# Patient Record
Sex: Female | Born: 1935 | Race: Black or African American | Hispanic: No | Marital: Married | Smoking: Never smoker
Health system: Southern US, Community
[De-identification: ages and names within clinical notes are randomized; demographics above are authoritative.]

## PROBLEM LIST (undated history)

## (undated) DIAGNOSIS — E119 Type 2 diabetes mellitus without complications: Secondary | ICD-10-CM

---

## 2004-05-19 ENCOUNTER — Emergency Department (HOSPITAL_COMMUNITY): Admission: EM | Admit: 2004-05-19 | Discharge: 2004-05-19 | Payer: Self-pay | Admitting: Emergency Medicine

## 2004-07-05 ENCOUNTER — Inpatient Hospital Stay (HOSPITAL_COMMUNITY): Admission: EM | Admit: 2004-07-05 | Discharge: 2004-07-07 | Payer: Self-pay | Admitting: Emergency Medicine

## 2009-04-10 ENCOUNTER — Ambulatory Visit (HOSPITAL_COMMUNITY): Admission: RE | Admit: 2009-04-10 | Discharge: 2009-04-10 | Payer: Self-pay | Admitting: Unknown Physician Specialty

## 2009-04-10 ENCOUNTER — Ambulatory Visit: Payer: Self-pay | Admitting: Internal Medicine

## 2009-12-27 ENCOUNTER — Ambulatory Visit: Payer: Self-pay | Admitting: Internal Medicine

## 2009-12-27 DIAGNOSIS — Z87448 Personal history of other diseases of urinary system: Secondary | ICD-10-CM | POA: Insufficient documentation

## 2009-12-27 DIAGNOSIS — I10 Essential (primary) hypertension: Secondary | ICD-10-CM | POA: Insufficient documentation

## 2009-12-27 DIAGNOSIS — I509 Heart failure, unspecified: Secondary | ICD-10-CM | POA: Insufficient documentation

## 2009-12-27 DIAGNOSIS — N189 Chronic kidney disease, unspecified: Secondary | ICD-10-CM

## 2009-12-27 DIAGNOSIS — R21 Rash and other nonspecific skin eruption: Secondary | ICD-10-CM | POA: Insufficient documentation

## 2009-12-27 DIAGNOSIS — E860 Dehydration: Secondary | ICD-10-CM

## 2009-12-29 DIAGNOSIS — E785 Hyperlipidemia, unspecified: Secondary | ICD-10-CM | POA: Insufficient documentation

## 2010-01-02 LAB — CONVERTED CEMR LAB
Basophils Absolute: 0 10*3/uL (ref 0.0–0.1)
Basophils Relative: 0.5 % (ref 0.0–3.0)
Eosinophils Absolute: 0.4 10*3/uL (ref 0.0–0.7)
Eosinophils Relative: 4.8 % (ref 0.0–5.0)
HCT: 32.7 % — ABNORMAL LOW (ref 36.0–46.0)
Hemoglobin: 10.8 g/dL — ABNORMAL LOW (ref 12.0–15.0)
IgE (Immunoglobulin E), Serum: 251.1 intl units/mL — ABNORMAL HIGH (ref 0.0–180.0)
Lymphocytes Relative: 24.4 % (ref 12.0–46.0)
Lymphs Abs: 2.2 10*3/uL (ref 0.7–4.0)
MCHC: 33 g/dL (ref 30.0–36.0)
MCV: 79.3 fL (ref 78.0–100.0)
Monocytes Absolute: 0.5 10*3/uL (ref 0.1–1.0)
Monocytes Relative: 6.1 % (ref 3.0–12.0)
Neutro Abs: 5.7 10*3/uL (ref 1.4–7.7)
Neutrophils Relative %: 64.2 % (ref 43.0–77.0)
Platelets: 221 10*3/uL (ref 150.0–400.0)
RBC: 4.12 M/uL (ref 3.87–5.11)
RDW: 15.9 % — ABNORMAL HIGH (ref 11.5–14.6)
WBC: 8.9 10*3/uL (ref 4.5–10.5)

## 2010-03-12 ENCOUNTER — Ambulatory Visit (HOSPITAL_COMMUNITY): Admission: RE | Admit: 2010-03-12 | Discharge: 2010-03-12 | Payer: Self-pay | Admitting: Unknown Physician Specialty

## 2010-04-08 ENCOUNTER — Ambulatory Visit: Admission: RE | Admit: 2010-04-08 | Discharge: 2010-04-08 | Payer: Self-pay | Admitting: Unknown Physician Specialty

## 2010-04-08 ENCOUNTER — Ambulatory Visit (HOSPITAL_COMMUNITY): Admission: RE | Admit: 2010-04-08 | Discharge: 2010-04-08 | Payer: Self-pay | Admitting: Unknown Physician Specialty

## 2010-05-01 ENCOUNTER — Inpatient Hospital Stay (HOSPITAL_COMMUNITY)
Admission: EM | Admit: 2010-05-01 | Discharge: 2010-05-05 | Payer: Self-pay | Source: Home / Self Care | Admitting: Emergency Medicine

## 2010-08-29 ENCOUNTER — Emergency Department (HOSPITAL_COMMUNITY)
Admission: EM | Admit: 2010-08-29 | Discharge: 2010-08-29 | Payer: Self-pay | Source: Home / Self Care | Admitting: Emergency Medicine

## 2010-08-30 ENCOUNTER — Inpatient Hospital Stay (HOSPITAL_COMMUNITY)
Admission: EM | Admit: 2010-08-30 | Discharge: 2010-09-08 | Payer: Self-pay | Source: Home / Self Care | Attending: Internal Medicine | Admitting: Internal Medicine

## 2010-10-23 NOTE — Assessment & Plan Note (Signed)
Summary: ALLERGY PROBLEM/ MBW   Vital Signs:  Patient profile:   75 year old female Weight:      217.38 pounds O2 Sat:      98 % on Room air Pulse rate:   83 / minute BP sitting:   110 / 70  (left arm) Cuff size:   regular  Vitals Entered By: Reynaldo Minium CMA (December 27, 2009 9:39 AM)  O2 Flow:  Room air  History of Present Illness: December 27, 2009- 74 yoF with mental limitation, referred by staff at [facility] on behalf of Dr Candie Echevaria. Verbal report wa of concern about allergy evaluation for 'breaking out'. Details and description were not available at the time of our meeting and the patient couldn't confirm or clarify this problem. She actively denied any rash, itching or other acute discomfort. She did not seem to be a reliable source of information. An attendant/ driver offerred no additional informatin. Phone call to Judeth Cornfield at [facility] reported the problem.  Reviewe of med list shows diphenhydramine available but not listed as currently taking it.  Preventive Screening-Counseling & Management  Alcohol-Tobacco     Smoking Status: never  Current Medications (verified): 1)  Multivitamins  Tabs (Multiple Vitamin) .... Take 1 By Mouth Once Daily 2)  Hydrochlorothiazide 25 Mg Tabs (Hydrochlorothiazide) .... Take 1 By Mouth Once Daily 3)  Aspirin 81 Mg Tbec (Aspirin) .... Take 1 By Mouth Once Daily 4)  Gemfibrozil 600 Mg Tabs (Gemfibrozil) .... Take 1 By Mouth Two Times A Day 5)  Enalapril Maleate 10 Mg Tabs (Enalapril Maleate) .... Take 1 By Mouth Two Times A Day 6)  Metformin Hcl 850 Mg Tabs (Metformin Hcl) .... Take 1 By Mouth Two Times A Day 7)  Meloxicam 7.5 Mg Tabs (Meloxicam) .... Take 1 By Mouth Once Daily For Oa 8)  Lactulose 10 Gm/83ml Soln (Lactulose) .... Give 30cc By Mouth Once Daily 9)  Simvastatin 40 Mg Tabs (Simvastatin) .... Take 1 By Mouth Once Daily 10)  Diphenhydramine Hcl 25 Mg Caps (Diphenhydramine Hcl) .... Taie 1 By Mouth Every 4 Hours  As Needed For Ithiness, Insomnia,or Allergy 11)  Guaifenesin 100 Mg/47ml Syrp (Guaifenesin) .... Take 2 Teaspoonsful By Mouth Every 4 Hours As Needed For Cough 12)  Loratadine 10 Mg Tabs (Loratadine) .... Take 1 By Mouth Once Daily As Needed 13)  Humulin N 100 Unit/ml Susp (Insulin Isophane Human) .... Inject 40 Units Subcutaneously Every Morning 14)  Humulin N 100 Unit/ml Susp (Insulin Isophane Human) .... Inject 20 Units Subcutaneously At Bedtime 15)  Humulin R 100 Unit/ml Soln (Insulin Regular Human) .... Sliding Scale  Allergies (verified): No Known Drug Allergies  Past History:  Family History: Last updated: 12/27/2009 Unavailable from patient  Social History: Last updated: 12/27/2009 Single No children Lives at [facility] Patient never smoked.   Risk Factors: Smoking Status: never (12/27/2009)  Past Medical History: Diabetes Rash Hyperlipidemia  Past Surgical History: Patient didn't remember any surgeries  Family History: Unavailable from patient  Social History: Single No children Lives at [facility] Patient never smoked.  Smoking Status:  never  Review of Systems      See HPI       The patient complains of sneezing.  The patient denies shortness of breath with activity, shortness of breath at rest, productive cough, non-productive cough, coughing up blood, chest pain, irregular heartbeats, acid heartburn, indigestion, loss of appetite, weight change, abdominal pain, difficulty swallowing, sore throat, tooth/dental problems,  headaches, nasal congestion/difficulty breathing through nose, itching, ear ache, anxiety, depression, hand/feet swelling, joint stiffness or pain, rash, change in color of mucus, and fever.    Physical Exam  Additional Exam:  General: A/Ox3; pleasant and cooperative, NAD,obese,  Skin:- No rash or excoriation. A hyuperpigmented scar on left upper quadrant is nonspecific. Old fungal dermatosis is not ruled out NODES: no  lymphadenopathy HEENT: [place]/AT, EOM- WNL, Conjuctivae- clear, PERRLA, TM-WNL, Nose- clear, Throat- clear and wnl, many missing teeth NECK: Supple w/ fair ROM, JVD- none, normal carotid impulses w/o bruits Thyroid- normal to palpation CHEST: Clear to P&A HEART: RRR, no m/g/r heard ABDOMEN: Soft and nl;  ZOX:WRUE, nl pulses, no edema , rolling walker NEURO: Grossly intact to observation      Impression & Recommendations:  Problem # 1:  SKIN RASH (ICD-782.1)  We got almost no hx and she can offer none, denying that she has had rash, itching or known allergy. I can only speculate she might have had urticaria from her ACEI ( Enalapril) and if problem continues, this might be changed.This drug class may be associated with angioedema and hives. There is a nonspecific area on abdomen that could be an old scar, but might have  been an old fungal dermatosis like blastomycosis. She didn't remember where it came from. We will get CBC and Immunocap to assess IgE activation as might be associated with an environmental allergy.  Medications Added to Medication List This Visit: 1)  Multivitamins Tabs (Multiple vitamin) .... Take 1 by mouth once daily 2)  Hydrochlorothiazide 25 Mg Tabs (Hydrochlorothiazide) .... Take 1 by mouth once daily 3)  Aspirin 81 Mg Tbec (Aspirin) .... Take 1 by mouth once daily 4)  Gemfibrozil 600 Mg Tabs (Gemfibrozil) .... Take 1 by mouth two times a day 5)  Enalapril Maleate 10 Mg Tabs (Enalapril maleate) .... Take 1 by mouth two times a day 6)  Metformin Hcl 850 Mg Tabs (Metformin hcl) .... Take 1 by mouth two times a day 7)  Meloxicam 7.5 Mg Tabs (Meloxicam) .... Take 1 by mouth once daily for oa 8)  Lactulose 10 Gm/47ml Soln (Lactulose) .... Give 30cc by mouth once daily 9)  Simvastatin 40 Mg Tabs (Simvastatin) .... Take 1 by mouth once daily 10)  Diphenhydramine Hcl 25 Mg Caps (Diphenhydramine hcl) .... Taie 1 by mouth every 4 hours as needed for ithiness, insomnia,or  allergy 11)  Guaifenesin 100 Mg/81ml Syrp (Guaifenesin) .... Take 2 teaspoonsful by mouth every 4 hours as needed for cough 12)  Loratadine 10 Mg Tabs (Loratadine) .... Take 1 by mouth once daily as needed 13)  Humulin N 100 Unit/ml Susp (Insulin isophane human) .... Inject 40 units subcutaneously every morning 14)  Humulin N 100 Unit/ml Susp (Insulin isophane human) .... Inject 20 units subcutaneously at bedtime 15)  Humulin R 100 Unit/ml Soln (Insulin regular human) .... Sliding scale  Other Orders: Consultation Level II (45409) TLB-CBC Platelet - w/Differential (85025-CBCD) T-Allergy Profile Region II-DC, [place], [place], [place], [place] [phone])  Patient Instructions: 1)  Please schedule a follow-up appointment as needed. 2)  lab 3)  If rash is seen again, then suggest possible urticaria from the ACE inhibitor Enalapril. Consider changing this to a different drug class like Diovan or Benicar if appropriate 4)  I can see her again if needed, but would ask a more detailed referral history than she was able to give today.

## 2010-10-23 NOTE — Progress Notes (Signed)
Summary: Physician's handwritten notes/[facility]  Physician's handwritten notes/[facility]   Imported By: Sherian Rein 01/01/2010 08:56:41  _____________________________________________________________________  External Attachment:    Type:   Image     Comment:   External Document

## 2010-12-01 LAB — GLUCOSE, CAPILLARY
Glucose-Capillary: 123 mg/dL — ABNORMAL HIGH (ref 70–99)
Glucose-Capillary: 130 mg/dL — ABNORMAL HIGH (ref 70–99)
Glucose-Capillary: 132 mg/dL — ABNORMAL HIGH (ref 70–99)
Glucose-Capillary: 133 mg/dL — ABNORMAL HIGH (ref 70–99)
Glucose-Capillary: 136 mg/dL — ABNORMAL HIGH (ref 70–99)
Glucose-Capillary: 139 mg/dL — ABNORMAL HIGH (ref 70–99)
Glucose-Capillary: 146 mg/dL — ABNORMAL HIGH (ref 70–99)
Glucose-Capillary: 154 mg/dL — ABNORMAL HIGH (ref 70–99)
Glucose-Capillary: 154 mg/dL — ABNORMAL HIGH (ref 70–99)
Glucose-Capillary: 156 mg/dL — ABNORMAL HIGH (ref 70–99)
Glucose-Capillary: 165 mg/dL — ABNORMAL HIGH (ref 70–99)
Glucose-Capillary: 208 mg/dL — ABNORMAL HIGH (ref 70–99)
Glucose-Capillary: 135 mg/dL — ABNORMAL HIGH (ref 70–99)
Glucose-Capillary: 140 mg/dL — ABNORMAL HIGH (ref 70–99)
Glucose-Capillary: 152 mg/dL — ABNORMAL HIGH (ref 70–99)
Glucose-Capillary: 164 mg/dL — ABNORMAL HIGH (ref 70–99)
Glucose-Capillary: 168 mg/dL — ABNORMAL HIGH (ref 70–99)
Glucose-Capillary: 178 mg/dL — ABNORMAL HIGH (ref 70–99)
Glucose-Capillary: 179 mg/dL — ABNORMAL HIGH (ref 70–99)
Glucose-Capillary: 215 mg/dL — ABNORMAL HIGH (ref 70–99)
Glucose-Capillary: 216 mg/dL — ABNORMAL HIGH (ref 70–99)
Glucose-Capillary: 240 mg/dL — ABNORMAL HIGH (ref 70–99)

## 2010-12-01 LAB — COMPREHENSIVE METABOLIC PANEL
ALT: 32 U/L (ref 0–35)
ALT: 32 U/L (ref 0–35)
ALT: 17 U/L (ref 0–35)
ALT: 19 U/L (ref 0–35)
AST: 30 U/L (ref 0–37)
AST: 31 U/L (ref 0–37)
AST: 35 U/L (ref 0–37)
AST: 31 U/L (ref 0–37)
Albumin: 2.6 g/dL — ABNORMAL LOW (ref 3.5–5.2)
Albumin: 2.7 g/dL — ABNORMAL LOW (ref 3.5–5.2)
Albumin: 3 g/dL — ABNORMAL LOW (ref 3.5–5.2)
Alkaline Phosphatase: 73 U/L (ref 39–117)
Alkaline Phosphatase: 87 U/L (ref 39–117)
Alkaline Phosphatase: 95 U/L (ref 39–117)
BUN: 14 mg/dL (ref 6–23)
BUN: 19 mg/dL (ref 6–23)
BUN: 22 mg/dL (ref 6–23)
BUN: 19 mg/dL (ref 6–23)
CO2: 22 mEq/L (ref 19–32)
CO2: 19 mEq/L (ref 19–32)
CO2: 22 mEq/L (ref 19–32)
Calcium: 9 mg/dL (ref 8.4–10.5)
Calcium: 9.2 mg/dL (ref 8.4–10.5)
Calcium: 8.7 mg/dL (ref 8.4–10.5)
Calcium: 9.4 mg/dL (ref 8.4–10.5)
Chloride: 102 mEq/L (ref 96–112)
Chloride: 104 mEq/L (ref 96–112)
Chloride: 107 mEq/L (ref 96–112)
Creatinine, Ser: 0.91 mg/dL (ref 0.4–1.2)
GFR calc Af Amer: >60 mL/min (ref >60)
GFR calc Af Amer: >60 mL/min (ref >60)
GFR calc Af Amer: >60 mL/min (ref >60)
GFR calc Af Amer: >60 mL/min (ref >60)
GFR calc non Af Amer: >60 mL/min (ref >60)
GFR calc non Af Amer: 46 mL/min — ABNORMAL LOW (ref >60)
GFR calc non Af Amer: 56 mL/min — ABNORMAL LOW (ref >60)
Glucose, Bld: 122 mg/dL — ABNORMAL HIGH (ref 70–99)
Glucose, Bld: 145 mg/dL — ABNORMAL HIGH (ref 70–99)
Glucose, Bld: 122 mg/dL — ABNORMAL HIGH (ref 70–99)
Potassium: 3.4 mEq/L — ABNORMAL LOW (ref 3.5–5.1)
Potassium: 3.6 mEq/L (ref 3.5–5.1)
Potassium: 4.3 mEq/L (ref 3.5–5.1)
Potassium: 4.3 mEq/L (ref 3.5–5.1)
Sodium: 137 mEq/L (ref 135–145)
Sodium: 138 mEq/L (ref 135–145)
Sodium: 136 mEq/L (ref 135–145)
Sodium: 139 mEq/L (ref 135–145)
Total Bilirubin: 0.5 mg/dL (ref 0.3–1.2)
Total Bilirubin: 0.6 mg/dL (ref 0.3–1.2)
Total Bilirubin: 0.9 mg/dL (ref 0.3–1.2)
Total Protein: 6.4 g/dL (ref 6.0–8.3)
Total Protein: 6.4 g/dL (ref 6.0–8.3)
Total Protein: 6.9 g/dL (ref 6.0–8.3)
Total Protein: 7.2 g/dL (ref 6.0–8.3)

## 2010-12-01 LAB — BASIC METABOLIC PANEL
BUN: 10 mg/dL (ref 6–23)
CO2: 23 mEq/L (ref 19–32)
Calcium: 9 mg/dL (ref 8.4–10.5)
Chloride: 103 mEq/L (ref 96–112)
Chloride: 105 mEq/L (ref 96–112)
Creatinine, Ser: 0.86 mg/dL (ref 0.4–1.2)
GFR calc Af Amer: >60 mL/min (ref >60)
GFR calc Af Amer: >60 mL/min (ref >60)
GFR calc Af Amer: >60 mL/min (ref >60)
GFR calc non Af Amer: >60 mL/min (ref >60)
Glucose, Bld: 176 mg/dL — ABNORMAL HIGH (ref 70–99)
Potassium: 3.5 mEq/L (ref 3.5–5.1)
Potassium: 3.7 mEq/L (ref 3.5–5.1)
Potassium: 3.8 mEq/L (ref 3.5–5.1)
Sodium: 136 mEq/L (ref 135–145)
Sodium: 137 mEq/L (ref 135–145)

## 2010-12-01 LAB — CBC
HCT: 34.8 % — ABNORMAL LOW (ref 36.0–46.0)
HCT: 38 % (ref 36.0–46.0)
HCT: 38.4 % (ref 36.0–46.0)
HCT: 34 % — ABNORMAL LOW (ref 36.0–46.0)
HCT: 36.9 % (ref 36.0–46.0)
HCT: 38.1 % (ref 36.0–46.0)
Hemoglobin: 11.4 g/dL — ABNORMAL LOW (ref 12.0–15.0)
Hemoglobin: 12.9 g/dL (ref 12.0–15.0)
Hemoglobin: 10.9 g/dL — ABNORMAL LOW (ref 12.0–15.0)
Hemoglobin: 11.4 g/dL — ABNORMAL LOW (ref 12.0–15.0)
Hemoglobin: 12.5 g/dL (ref 12.0–15.0)
MCH: 25 pg — ABNORMAL LOW (ref 26.0–34.0)
MCH: 25.8 pg — ABNORMAL LOW (ref 26.0–34.0)
MCH: 25.2 pg — ABNORMAL LOW (ref 26.0–34.0)
MCH: 25.5 pg — ABNORMAL LOW (ref 26.0–34.0)
MCHC: 32.8 g/dL (ref 30.0–36.0)
MCHC: 32.9 g/dL (ref 30.0–36.0)
MCHC: 33.6 g/dL (ref 30.0–36.0)
MCHC: 32.8 g/dL (ref 30.0–36.0)
MCV: 76.3 fL — ABNORMAL LOW (ref 78.0–100.0)
MCV: 76.8 fL — ABNORMAL LOW (ref 78.0–100.0)
MCV: 77.7 fL — ABNORMAL LOW (ref 78.0–100.0)
MCV: 75.9 fL — ABNORMAL LOW (ref 78.0–100.0)
MCV: 76.1 fL — ABNORMAL LOW (ref 78.0–100.0)
MCV: 77.8 fL — ABNORMAL LOW (ref 78.0–100.0)
Platelets: 214 10*3/uL (ref 150–400)
Platelets: 236 10*3/uL (ref 150–400)
Platelets: 199 10*3/uL (ref 150–400)
RBC: 4.56 MIL/uL (ref 3.87–5.11)
RBC: 5 MIL/uL (ref 3.87–5.11)
RBC: 4.27 MIL/uL (ref 3.87–5.11)
RBC: 4.48 MIL/uL (ref 3.87–5.11)
RBC: 4.85 MIL/uL (ref 3.87–5.11)
RDW: 14 % (ref 11.5–15.5)
RDW: 14.1 % (ref 11.5–15.5)
RDW: 14.1 % (ref 11.5–15.5)
RDW: 14.2 % (ref 11.5–15.5)
WBC: 10 10*3/uL (ref 4.0–10.5)
WBC: 11.5 10*3/uL — ABNORMAL HIGH (ref 4.0–10.5)
WBC: 8 10*3/uL (ref 4.0–10.5)
WBC: 9.7 10*3/uL (ref 4.0–10.5)
WBC: 10.7 10*3/uL — ABNORMAL HIGH (ref 4.0–10.5)
WBC: 8.9 10*3/uL (ref 4.0–10.5)

## 2010-12-01 LAB — URINALYSIS, ROUTINE W REFLEX MICROSCOPIC
Nitrite: NEGATIVE
Specific Gravity, Urine: 1.026 (ref 1.005–1.030)
pH: 5.5 (ref 5.0–8.0)

## 2010-12-01 LAB — DIFFERENTIAL
Basophils Absolute: 0 10*3/uL (ref 0.0–0.1)
Basophils Absolute: 0 10*3/uL (ref 0.0–0.1)
Basophils Relative: 0 % (ref 0–1)
Basophils Relative: 1 % (ref 0–1)
Basophils Relative: 0 % (ref 0–1)
Eosinophils Absolute: 0.2 10*3/uL (ref 0.0–0.7)
Eosinophils Absolute: 0.3 10*3/uL (ref 0.0–0.7)
Eosinophils Absolute: 0 10*3/uL (ref 0.0–0.7)
Eosinophils Relative: 2 % (ref 0–5)
Eosinophils Relative: 2 % (ref 0–5)
Lymphocytes Relative: 25 % (ref 12–46)
Lymphs Abs: 2.8 10*3/uL (ref 0.7–4.0)
Lymphs Abs: 3.1 10*3/uL (ref 0.7–4.0)
Lymphs Abs: 1.7 10*3/uL (ref 0.7–4.0)
Monocytes Absolute: 0.6 10*3/uL (ref 0.1–1.0)
Monocytes Absolute: 0.8 10*3/uL (ref 0.1–1.0)
Monocytes Relative: 7 % (ref 3–12)
Monocytes Relative: 7 % (ref 3–12)
Monocytes Relative: 8 % (ref 3–12)
Neutro Abs: 4.4 10*3/uL (ref 1.7–7.7)
Neutro Abs: 7.5 10*3/uL (ref 1.7–7.7)
Neutro Abs: 9.3 10*3/uL — ABNORMAL HIGH (ref 1.7–7.7)
Neutrophils Relative %: 55 % (ref 43–77)
Neutrophils Relative %: 66 % (ref 43–77)
Neutrophils Relative %: 83 % — ABNORMAL HIGH (ref 43–77)

## 2010-12-01 LAB — CSF CULTURE W GRAM STAIN: Culture: NO GROWTH

## 2010-12-01 LAB — CULTURE, BLOOD (ROUTINE X 2)
Culture  Setup Time: 201112102021
Culture: NO GROWTH
Culture: NO GROWTH

## 2010-12-01 LAB — MAGNESIUM
Magnesium: 2 mg/dL (ref 1.5–2.5)
Magnesium: 2.2 mg/dL (ref 1.5–2.5)

## 2010-12-01 LAB — URINE MICROSCOPIC-ADD ON

## 2010-12-01 LAB — CSF CELL COUNT WITH DIFFERENTIAL
Lymphs, CSF: 2 % — ABNORMAL LOW (ref 40–80)
Segmented Neutrophils-CSF: 96 % — ABNORMAL HIGH (ref 0–6)
WBC, CSF: 49 /mm3 (ref 0–5)

## 2010-12-01 LAB — PROTIME-INR: INR: 1.15 (ref 0.00–1.49)

## 2010-12-01 LAB — LIPASE, BLOOD: Lipase: 16 U/L (ref 11–59)

## 2010-12-01 LAB — GRAM STAIN

## 2010-12-01 LAB — AMYLASE: Amylase: 46 U/L (ref 0–105)

## 2010-12-01 LAB — AMMONIA: Ammonia: 26 umol/L (ref 11–35)

## 2010-12-01 LAB — PROTEIN AND GLUCOSE, CSF: Total  Protein, CSF: 52 mg/dL — ABNORMAL HIGH (ref 15–45)

## 2010-12-01 LAB — URINE CULTURE

## 2010-12-01 LAB — HERPES SIMPLEX VIRUS(HSV) DNA BY PCR

## 2010-12-02 LAB — BASIC METABOLIC PANEL
BUN: 18 mg/dL (ref 6–23)
Calcium: 9.8 mg/dL (ref 8.4–10.5)
Creatinine, Ser: 0.98 mg/dL (ref 0.4–1.2)
GFR calc non Af Amer: 56 mL/min — ABNORMAL LOW (ref >60)
Glucose, Bld: 171 mg/dL — ABNORMAL HIGH (ref 70–99)
Potassium: 4.8 mEq/L (ref 3.5–5.1)

## 2010-12-02 LAB — URINE MICROSCOPIC-ADD ON

## 2010-12-02 LAB — DIFFERENTIAL
Eosinophils Relative: 0 % (ref 0–5)
Monocytes Absolute: 0.7 10*3/uL (ref 0.1–1.0)
Monocytes Relative: 5 % (ref 3–12)
Neutrophils Relative %: 80 % — ABNORMAL HIGH (ref 43–77)

## 2010-12-02 LAB — AMMONIA: Ammonia: 28 umol/L (ref 11–35)

## 2010-12-02 LAB — URINALYSIS, ROUTINE W REFLEX MICROSCOPIC
Bilirubin Urine: NEGATIVE
Nitrite: NEGATIVE
Protein, ur: 100 mg/dL — AB
Specific Gravity, Urine: 1.019 (ref 1.005–1.030)
Urobilinogen, UA: 0.2 mg/dL (ref 0.0–1.0)

## 2010-12-02 LAB — CBC
HCT: 38.2 % (ref 36.0–46.0)
MCHC: 33.5 g/dL (ref 30.0–36.0)
WBC: 14.5 10*3/uL — ABNORMAL HIGH (ref 4.0–10.5)

## 2010-12-02 LAB — TROPONIN I: Troponin I: 0.01 ng/mL (ref 0.00–0.06)

## 2010-12-04 LAB — BASIC METABOLIC PANEL
CO2: 24 mEq/L (ref 19–32)
Calcium: 9.3 mg/dL (ref 8.4–10.5)
Creatinine, Ser: 1.11 mg/dL (ref 0.4–1.2)
GFR calc Af Amer: 58 mL/min — ABNORMAL LOW (ref >60)
GFR calc non Af Amer: 48 mL/min — ABNORMAL LOW (ref >60)

## 2010-12-04 LAB — GLUCOSE, CAPILLARY

## 2010-12-05 LAB — GLUCOSE, CAPILLARY
Glucose-Capillary: 106 mg/dL — ABNORMAL HIGH (ref 70–99)
Glucose-Capillary: 107 mg/dL — ABNORMAL HIGH (ref 70–99)
Glucose-Capillary: 112 mg/dL — ABNORMAL HIGH (ref 70–99)
Glucose-Capillary: 115 mg/dL — ABNORMAL HIGH (ref 70–99)
Glucose-Capillary: 124 mg/dL — ABNORMAL HIGH (ref 70–99)
Glucose-Capillary: 128 mg/dL — ABNORMAL HIGH (ref 70–99)
Glucose-Capillary: 35 mg/dL — CL (ref 70–99)
Glucose-Capillary: 39 mg/dL — CL (ref 70–99)
Glucose-Capillary: 43 mg/dL — CL (ref 70–99)
Glucose-Capillary: 44 mg/dL — CL (ref 70–99)
Glucose-Capillary: 54 mg/dL — ABNORMAL LOW (ref 70–99)
Glucose-Capillary: 83 mg/dL (ref 70–99)
Glucose-Capillary: 91 mg/dL (ref 70–99)
Glucose-Capillary: 93 mg/dL (ref 70–99)

## 2010-12-05 LAB — LACTIC ACID, PLASMA: Lactic Acid, Venous: 2.3 mmol/L — ABNORMAL HIGH (ref 0.5–2.2)

## 2010-12-05 LAB — TSH: TSH: 0.983 u[IU]/mL (ref 0.350–4.500)

## 2010-12-05 LAB — CBC
HCT: 30.7 % — ABNORMAL LOW (ref 36.0–46.0)
MCH: 26.4 pg (ref 26.0–34.0)
MCH: 26.4 pg (ref 26.0–34.0)
MCHC: 34 g/dL (ref 30.0–36.0)
MCV: 77.7 fL — ABNORMAL LOW (ref 78.0–100.0)
Platelets: 186 10*3/uL (ref 150–400)
Platelets: 196 10*3/uL (ref 150–400)
RBC: 4.17 MIL/uL (ref 3.87–5.11)
RDW: 18.5 % — ABNORMAL HIGH (ref 11.5–15.5)
WBC: 10.9 10*3/uL — ABNORMAL HIGH (ref 4.0–10.5)
WBC: 8.9 10*3/uL (ref 4.0–10.5)

## 2010-12-05 LAB — DIFFERENTIAL
Eosinophils Absolute: 0.1 10*3/uL (ref 0.0–0.7)
Eosinophils Relative: 7 % — ABNORMAL HIGH (ref 0–5)
Lymphocytes Relative: 16 % (ref 12–46)
Lymphocytes Relative: 23 % (ref 12–46)
Lymphs Abs: 1.8 10*3/uL (ref 0.7–4.0)
Lymphs Abs: 2.1 10*3/uL (ref 0.7–4.0)
Monocytes Absolute: 0.7 10*3/uL (ref 0.1–1.0)
Neutrophils Relative %: 77 % (ref 43–77)

## 2010-12-05 LAB — URINE CULTURE
Colony Count: >=100000
Culture  Setup Time: 201108111537

## 2010-12-05 LAB — BASIC METABOLIC PANEL
BUN: 43 mg/dL — ABNORMAL HIGH (ref 6–23)
CO2: 20 mEq/L (ref 19–32)
CO2: 21 mEq/L (ref 19–32)
CO2: 21 mEq/L (ref 19–32)
Calcium: 9.6 mg/dL (ref 8.4–10.5)
Calcium: 9.9 mg/dL (ref 8.4–10.5)
Chloride: 107 mEq/L (ref 96–112)
Chloride: 118 mEq/L — ABNORMAL HIGH (ref 96–112)
Creatinine, Ser: 1.04 mg/dL (ref 0.4–1.2)
Creatinine, Ser: 1.2 mg/dL (ref 0.4–1.2)
Creatinine, Ser: 3.1 mg/dL — ABNORMAL HIGH (ref 0.4–1.2)
GFR calc Af Amer: 18 mL/min — ABNORMAL LOW (ref >60)
GFR calc Af Amer: >60 mL/min (ref >60)
GFR calc non Af Amer: 15 mL/min — ABNORMAL LOW (ref >60)
Glucose, Bld: 104 mg/dL — ABNORMAL HIGH (ref 70–99)
Potassium: 3.3 mEq/L — ABNORMAL LOW (ref 3.5–5.1)
Potassium: 4.1 mEq/L (ref 3.5–5.1)

## 2010-12-05 LAB — FERRITIN: Ferritin: 407 ng/mL — ABNORMAL HIGH (ref 10–291)

## 2010-12-05 LAB — URINALYSIS, ROUTINE W REFLEX MICROSCOPIC
Glucose, UA: NEGATIVE mg/dL
Protein, ur: NEGATIVE mg/dL
Specific Gravity, Urine: 1.025 (ref 1.005–1.030)
Urobilinogen, UA: 0.2 mg/dL (ref 0.0–1.0)

## 2010-12-05 LAB — COMPREHENSIVE METABOLIC PANEL
AST: 30 U/L (ref 0–37)
CO2: 19 mEq/L (ref 19–32)
Calcium: 9.3 mg/dL (ref 8.4–10.5)
Creatinine, Ser: 2.89 mg/dL — ABNORMAL HIGH (ref 0.4–1.2)
GFR calc Af Amer: 19 mL/min — ABNORMAL LOW (ref >60)
GFR calc non Af Amer: 16 mL/min — ABNORMAL LOW (ref >60)

## 2010-12-05 LAB — HEMOGLOBIN A1C
Hgb A1c MFr Bld: 5.7 % — ABNORMAL HIGH (ref <5.7)
Mean Plasma Glucose: 117 mg/dL — ABNORMAL HIGH (ref <117)

## 2010-12-05 LAB — IRON AND TIBC: Iron: 73 ug/dL (ref 42–135)

## 2010-12-05 LAB — MRSA PCR SCREENING: MRSA by PCR: NEGATIVE

## 2010-12-05 LAB — RETICULOCYTES
RBC.: 4.27 MIL/uL (ref 3.87–5.11)
Retic Count, Absolute: 51.2 10*3/uL (ref 19.0–186.0)

## 2010-12-05 LAB — URINE MICROSCOPIC-ADD ON

## 2010-12-05 LAB — GLUCOSE, RANDOM: Glucose, Bld: 123 mg/dL — ABNORMAL HIGH (ref 70–99)

## 2011-02-06 NOTE — Discharge Summary (Signed)
Kristin Mccarty, Kristin Mccarty              ACCOUNT NO.:  [id number]   MEDICAL RECORD NO.:  [id number]          PATIENT TYPE:  INP   LOCATION:  4730                         FACILITY:  [facility]   PHYSICIAN:  Hollice Espy, M.D.DATE OF BIRTH:  03-06-1936   DATE OF ADMISSION:  07/04/2004  DATE OF DISCHARGE:                                 DISCHARGE SUMMARY   DISCHARGE DIAGNOSES:  1.  Urinary tract infection.  2.  Dehydration.  3.  Renal insufficiency secondary to #1.  4.  Mild underlying dementia.  5.  Hypotension secondary to dehydration.  6.  History of diabetes mellitus.  7.  History of congestive heart failure.  8.  History of hypertension.   DISCHARGE MEDICATIONS:  The patient is being discharged on Cipro 250 mg p.o.  b.i.d. the next five days.  In addition, she will continue on her previous  medicines of aspirin, Lopid and Glucophage.  She is also on Vasotec and  Lasix.  I do not see any mention in the record as to the dose of these.  However, I feel that a contributing factor to her dehydration has been her  Lasix.  I am recommending that she, at this time, decrease her Lasix down to  20 mg p.o. daily.  In addition, she should be on the lowest possible dose of  Vasotec in risk for further hypotensive episodes.  We recommend she go on  Vasotec 5 mg p.o. daily.   HISTORY OF PRESENT ILLNESS:  The patient is a 75 year old African-American  female with a past medical history of hypertension, diabetes and CHF,  reportedly, but she tells me she does not really follow with a doctor.  I am  unsure as to who regulates her medications while she is in assisted-living.  She presented to the emergency room after being found very weak with a blood  pressure of 80/50.  When she first presented to the emergency room, she was  noted to have a BUN and creatinine of 59 and 2.0.  Her sodium was slightly  decreased at 133.  She also had a slightly elevated eosinophil count.  The  patient had a  urinalysis done which showed moderate leukocyte esterase and a  few bacteria as well as hyaline casts.  It was felt that the patient had a  mild urinary tract infection which made her dehydrated which,compounded with  her being on Lasix, made her hypotensive and very weak and further  increasing her confusion.  Her antihypertensives were held.  She was started  on IV fluids and received Cipro as well.  By the next day, she had greatly  improved.  Her creatinine had come down to 1.3.  Her BUN decreased down to  34.  She remains stable and with no complaints.  She appeared to be more  alert and talkative when I examined her.  She normally is also on Glucophage  which was held secondary to her increased creatinine.  With her creatinine  now back down to 1.3, this is being resumed.  The patient has continued to  do well.  The patient is  subsequently medically stable for discharge.  However, due to the fact that she lives in assisted-living, they were not  able to accept her on a Sunday, and the plan is to discharge her on July 07, 2004.   As an addendum, I have found a list of her medications.  These include:  1.  Vasotec 20 mg p.o. daily.  I would recommend that this dose actually be      decreased down to 10 mg daily.  2.  Multivitamin.  3.  Thiamine 100 mg p.o. daily.  4.  Ferrous sulfate for iron-deficiency anemia which is consistent with her      low MCV of 75.  This is to be continued as well.  5.  She will continue on her aspirin, Lopid and Glucophage.  Aspirin at 81      mg daily, Lopid 600 mg p.o. b.i.d. and Glucophage 500 mg p.o. b.i.d.      All of these are to be continued.  6.  She is on Lasix 40 mg daily and I have asked that this dose be cut down      to 20 mg daily.  7.  She is also, in addition, on Novolin insulin 34 units subcu q.a.m. and      18  units in the evening which we are continuing as well.   PLAN:  The patient is to follow up with her primary care doctor,  although it  is unclear if she actually sees a physician.  I am not sure if she has been  on the same continuous medications for a long time or if she actually has a  doctor and she is not telling me about it, as she does not know to tell me  about it.  The patient's disposition is improved.  Her diet will be a heart  healthy diabetic diet.  Activity will be as tolerated.      Send   SKK/MEDQ  D:  07/06/2004  T:  07/06/2004  Job:  [id number]

## 2011-02-06 NOTE — H&P (Signed)
NAME:  Kristin Mccarty, Kristin Mccarty              ACCOUNT NO.:  [id number]   MEDICAL RECORD NO.:  [id number]          PATIENT TYPE:  EMS   LOCATION:  MAJO                         FACILITY:  [facility]   PHYSICIAN:  Jackie Plum, M.D.DATE OF BIRTH:  [birth year]   DATE OF ADMISSION:  07/04/2004  DATE OF DISCHARGE:                                HISTORY & PHYSICAL   Please note that this patient came in with altered mental status change and  history is mainly obtained from a CNA at [facility] nursing home  and the emergency department physician, Dr. Stacie Acres, as well as patient's ED  records.   CHIEF COMPLAINT:  Mental status change.   HISTORY OF PRESENT ILLNESS:  The patient a 75 year old African-American lady  nursing home resident at [facility] nursing home.  She has history of  diabetes mellitus, congestive heart failure and hypertension as well as  questionable Wernicke's encephalopathy.  According to ED records she was  also seen at [facility] with whom I spoke to over the phone directly.  The  patient was apparently in her usual state of health until some time last  evening when she was noted to be more confused than usual.  She was also  noted to have passed out on two occasions.  Her syncopal episode is unclear  since the nurse (CNA) that I spoke to was absent at the time of these  episodes and the information was received by another nurse who was still  available at the time of my evaluation, and discussion of the patient's  history was with the CNA.  No other history was present and no other  antecedent significant symptomatology is available at this point in time.  Apparently the nursing home facility sent her by EMS and upon arrival her  blood pressure was 90/50 with a pulse rate of 110, respiratory rate of 18.  According to the EMS record the patient was said to be alert, and did not  have any significant abnormality except for the fact that she was a little  bit confused and denied  any symptoms.  Her CBG at that time was 95 mg/dl.  She was subsequently transported to [facility] emergency room whereupon her  initial blood pressure was noted to be 72/54 mmHg with a temperature of 97.1  degrees Fahrenheit, heart rate was 74/minute and respiratory rate was 26 per  minute. Her O2 saturation on room air was 99%.   She received 2 doses of Kay Ciel IV and intravenous supplementation was  started in the emergency department, and Hospitalist was requested for  admission.   PAST MEDICAL HISTORY:  Please see history of present illness above.   CURRENT MEDICATION LIST FROM NURSING HOME:  1.  Novolin N 100 units per vial, 34 units SQ q.a.m. and 18 units SQ q.h.s.  2.  Vasotec 20 mg p.o. daily.  3.  Vicon Forte 1 capsule daily.  4.  Ferrous sulfate 325 mg.  5.  Thiamine x1 tablet daily.  6.  Aspirin 81 mg daily.  7.  Lasix 40 mg daily.  8.  Lopid 600  mg daily.  9.  Glucophage 500 mg q.a.m. and q.h.s.   FAMILY HISTORY:  Not available.   SOCIAL HISTORY:  The patient resides in a nursing home ([facility]).   REVIEW OF SYSTEMS:  Cannot be obtained at this point.   PHYSICAL EXAMINATION:  VITAL SIGNS:  Blood pressure was 101/62, pulse 77,  respiratory rate 18, temperature 97.0 degrees Fahrenheit, O2 saturation of  96% on room air.  GENERAL:  She was in no acute cardiopulmonary distress.  HEENT:  Normocephalic, atraumatic.  Pupils are equal, round and reactive to  light. Extraocular movements were intact.  Oropharynx was dry, there was no  pharyngeal discharge or erythema.  NECK:  Supple, no jugular venous distension.  LUNGS:  Equal breath sounds, no crackles could be appreciated.  CARDIAC:  Auscultation revealed a regular rate and rhythm without murmurs or  gallops or murmurs.  ABDOMEN:  Obese, nontender, bowel sounds were present, normoactive.  EXTREMITIES:  No edema.  She had swelling and erythema in interdigital web  space area.  No obvious pus.  CNS:   She is alert and oriented x1.  No obvious acute focal deficits.   LABORATORY DATA:  WBC:  6900, hemoglobin 11.7, hematocrit 35.2, MCV 95.6,  platelet count 217,000.  Myoglobin point of care 309.  CK-MB 2.3, troponin-I  less than 0.05.  Sodium 133, potassium 3.2, chloride 97, CO2 24, glucose 95,  BUN 59, creatinine 2, calcium 9.1, total protein 7.3, albumin 3.6, AST 37,  ALT 29, alkaline phosphatase 85, bilirubin 0.8.  Urinalysis: Color yellow,  appearance cloudy, specific gravity 1.010, pH 5, negative glucose,  hemoglobin and bilirubin, ketones, total protein, and nitrite. Urobilinogen  0.2 mg/dl, leukocyte esterase moderate, urine white blood cell count's 11-  20/HPF, bacteria few, rbc's 0-2 and hyaline casts.   IMPRESSION:  1.  Acute hypertension secondary to volume depletion with prerenal azotemia.  2.  Syncope with concurrent mental status change probably secondary to      diagnosis #1.  3.  Hypokalemia and hyponatremia.   The cause of the patient's presentation is unclear.  However, the patient is  obviously dehydrated with volume depletion which may account for her  syncopal episode as well as her mental status change significant to  decreased cerebral hypoperfusion.  The patient does not have leukocytosis,  however, sepsis/urosepsis cannot be ruled out at this point.  She has history of diabetes and heart disease and we cannot rule out any  cardiac etiology at this point.   PLAN:  Admit patient to a telemetry bed, her blood pressure is up some right  now. Will replace her potassium, obtain TSH, cortisol, cultures of her blood  and urine, and give her a dose of antibiotic intravenously.  Will continue  her on intravenous normal saline judiciously in view of her heart disease,  to prevent any iatrogenic to develop.  She will be continued on oxygen by nasal cannula and we will cycle her enzymes.  We will hold diuretic and  antihypertensive medications for now.  We will check a  CBC and BMET in the  morning.  Further recommendations to be made depending as her data base responds.      Geor  GO/MEDQ  D:  07/05/2004  T:  07/05/2004  Job:  [id number]   cc:   Hollice Espy, M.D.

## 2012-03-04 IMAGING — CT CT HEAD W/O CM
1 series · 16 of 30 positions shown, 20 images · non-contrast
Comparison: 07/04/2004

CLINICAL DATA: Altered level of consciousness.  History of
dementia.

CT HEAD WITHOUT CONTRAST
TECHNIQUE: Contiguous axial images were obtained from the base of
the skull through the vertex without contrast.

[Series 2: head_seq 4.5 h37s st · axial · 0.40mm/px · z∈[+985,+1129]mm · 16 of 36 slices shown, 20 images]
[im 2/36  brain]
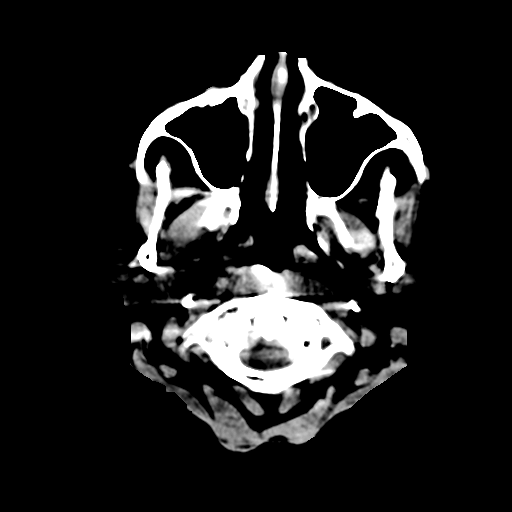
[im 2/36  bone]
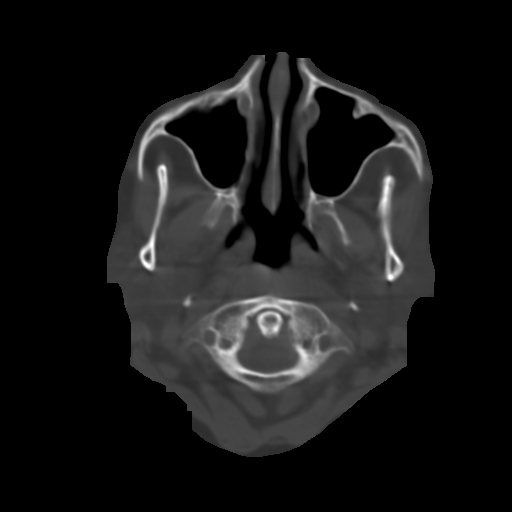
[im 4/36  brain]
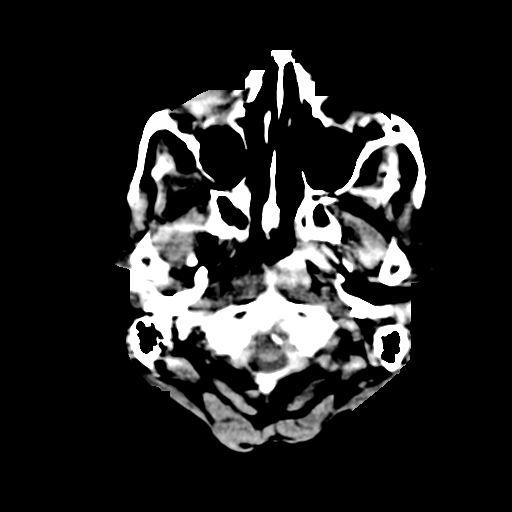
[im 7/36  brain]
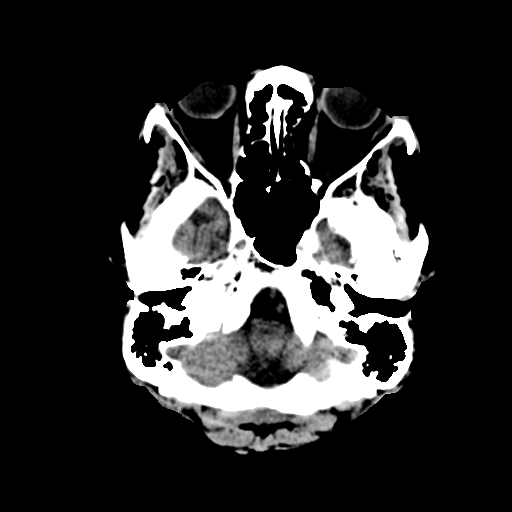
[im 9/36  brain]
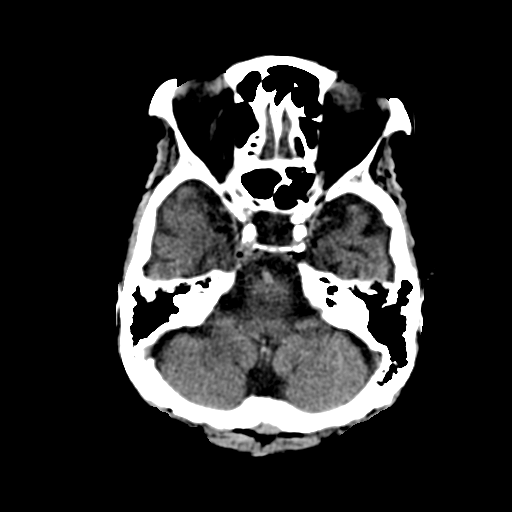
[im 10/36  brain]
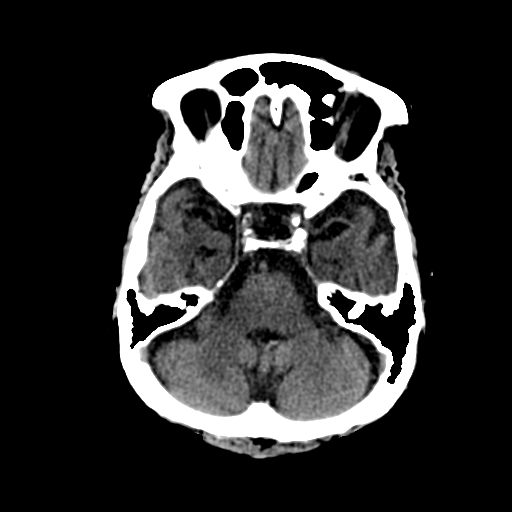
[im 10/36  bone]
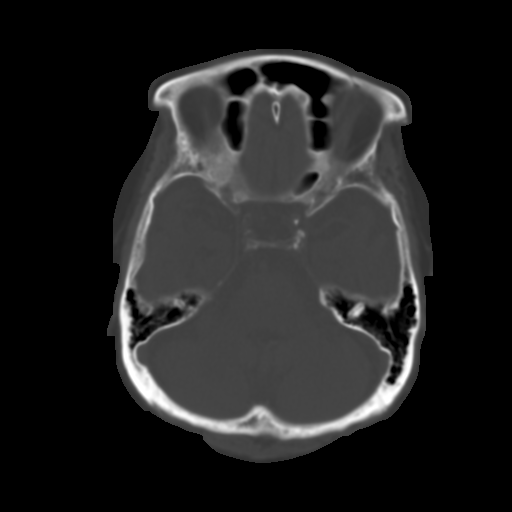
[im 13/36  brain]
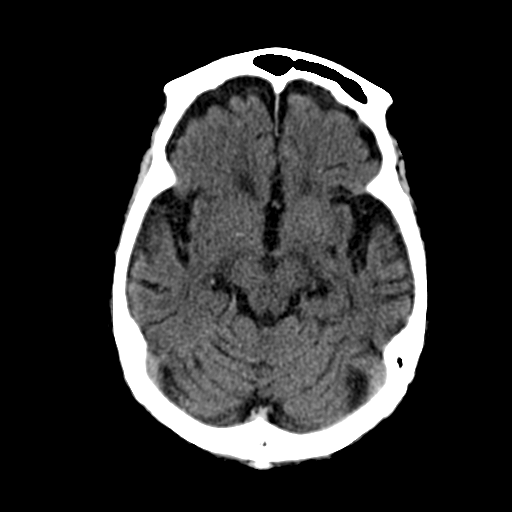
[im 15/36  brain]
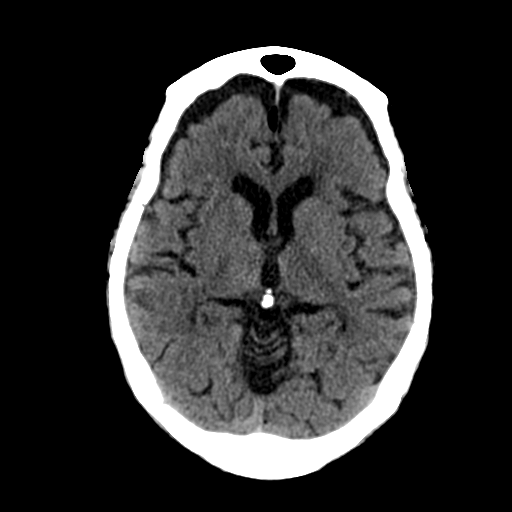
[im 17/36  brain]
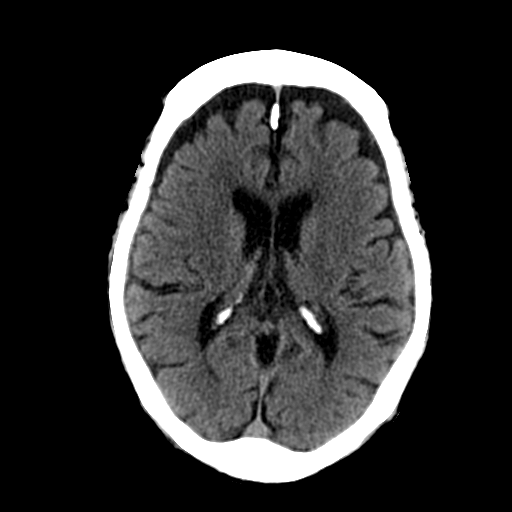
[im 19/36  brain]
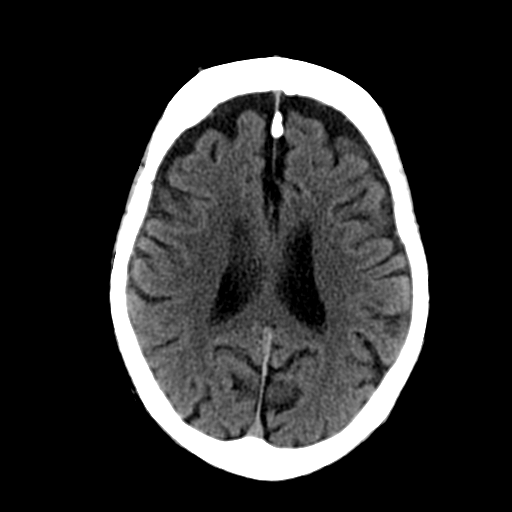
[im 19/36  bone]
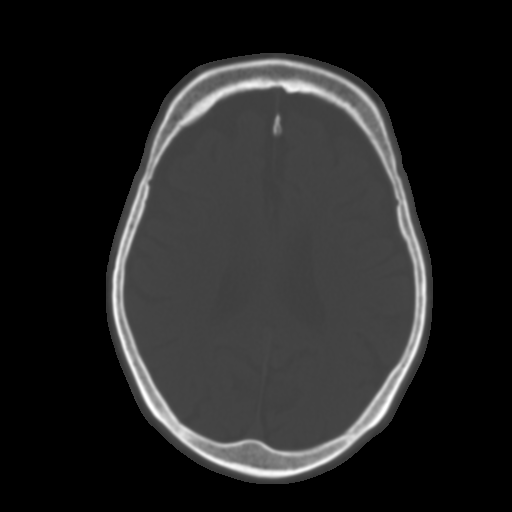
[im 21/36  brain]
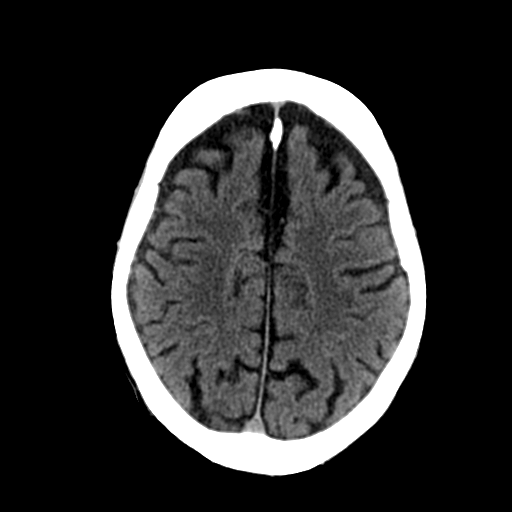
[im 23/36  brain]
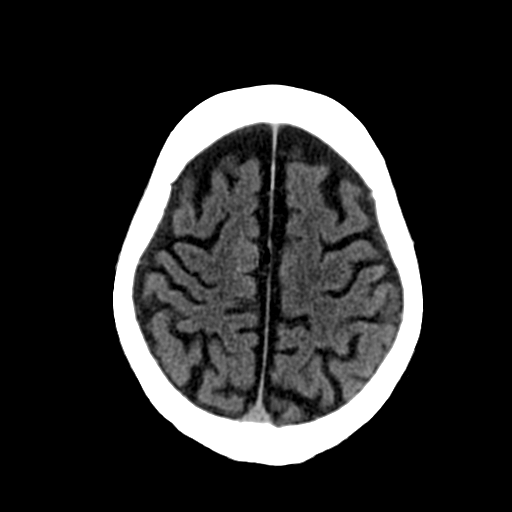
[im 26/36  brain]
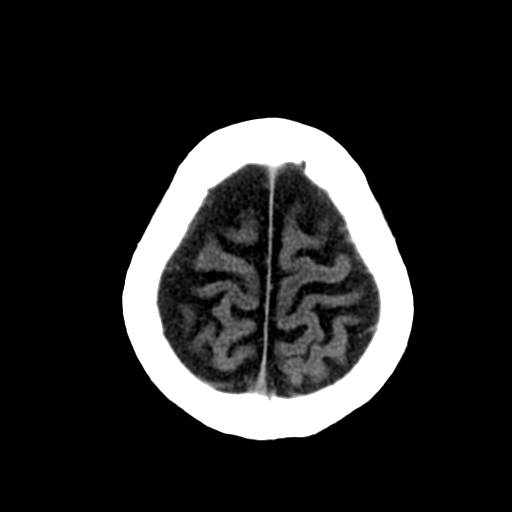
[im 27/36  brain]
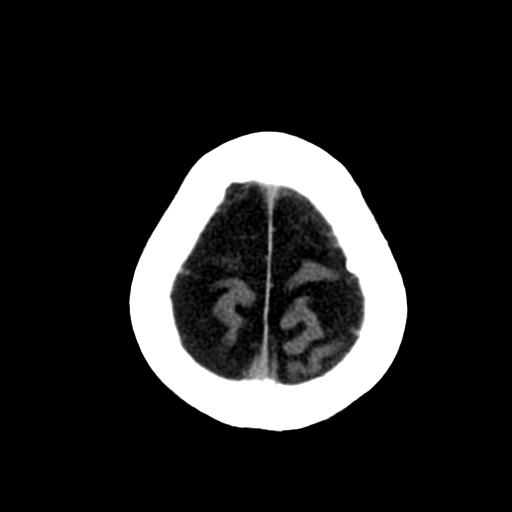
[im 27/36  bone]
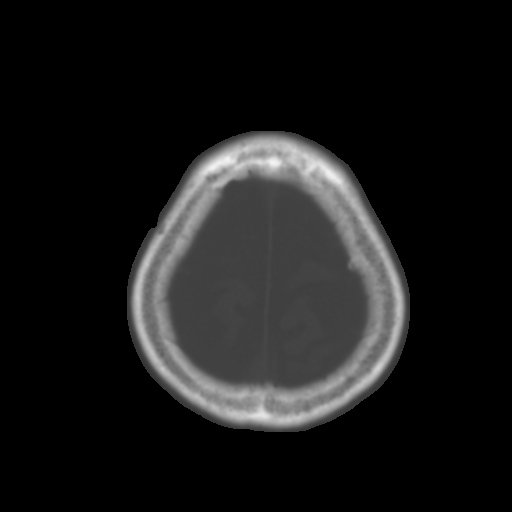
[im 29/36  brain]
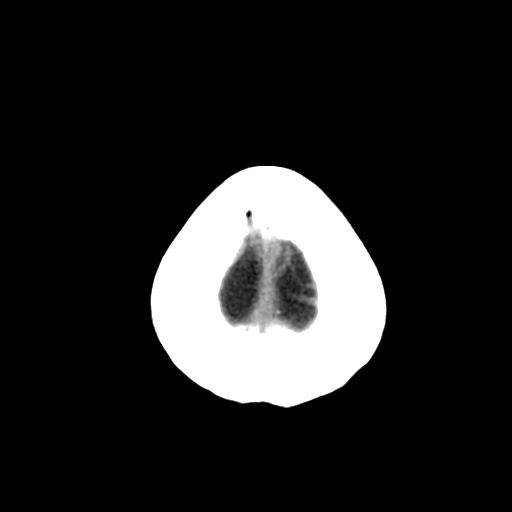
[im 32/36  brain]
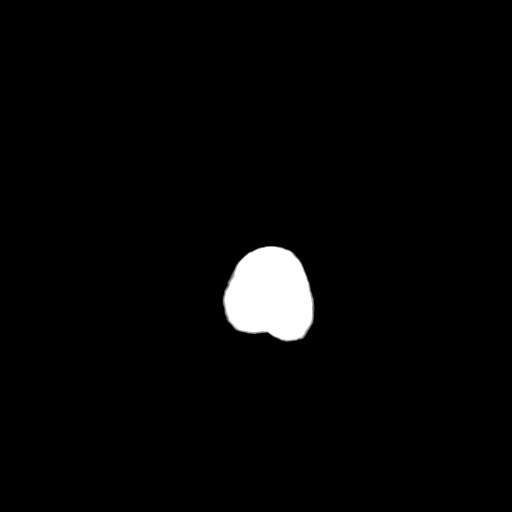
[im 34/36  brain]
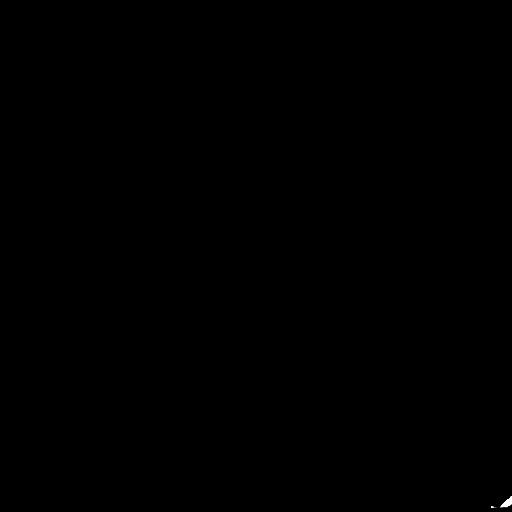

[16 of 30 positions shown; findings below may reference images not displayed]

FINDINGS: There is no evidence for acute infarction, intracranial
hemorrhage, mass lesion, hydrocephalus, or extra-axial fluid.
Moderate to severe atrophy is present.  Advanced chronic
microvascular ischemic change is present in the periventricular and
subcortical white matter.

Remote left superior cerebellar infarct.  Prominence of the frontal
extra-axial CSF spaces likely related to loss of cerebral volume.
Numerous parafalcine lipomas are noted and stable.

Hypodense the white matter likely reflects chronic microvascular
ischemic change.  There is bilateral carotid calcification.
Sinuses and mastoids are clear.
IMPRESSION: Atrophy and small vessel disease, similar priors.  No acute
intracranial findings.

## 2012-03-05 IMAGING — US US ABDOMEN COMPLETE
1 series · 14 of 25 positions shown · non-contrast
Comparison: None.

CLINICAL DATA: Abdominal pain, altered mental status.

COMPLETE ABDOMINAL ULTRASOUND

[Series 1: us abdomen complete · 0.30mm/px · 14 of 43 slices shown]
[im 1/43]
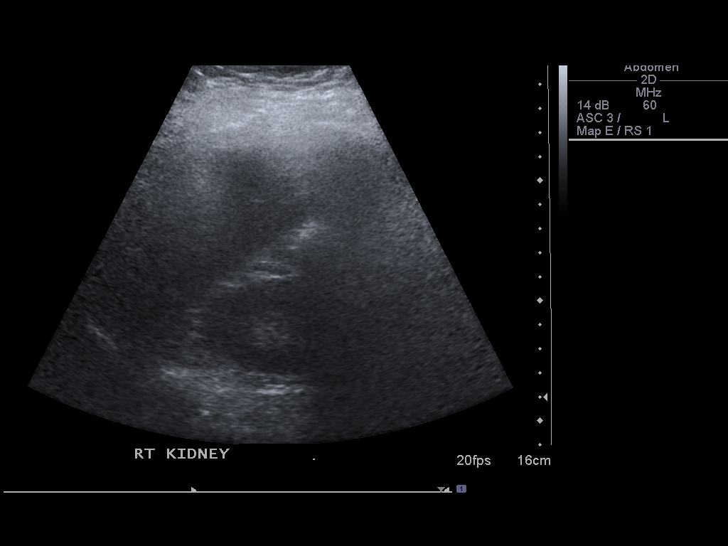
[im 4/43]
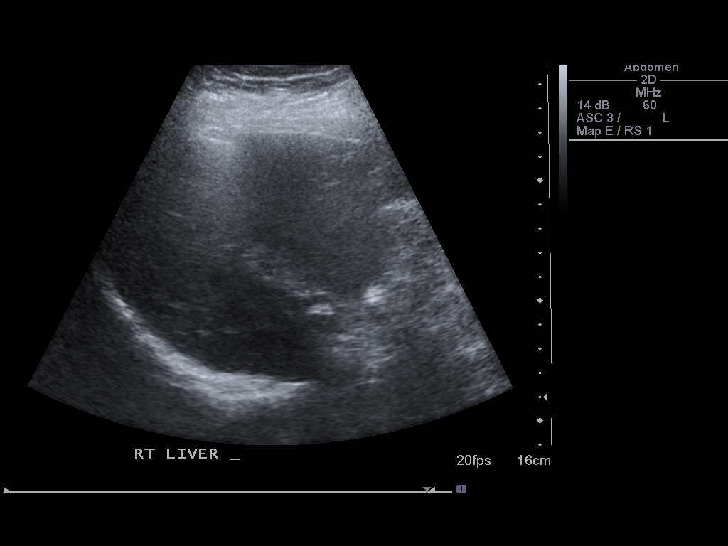
[im 8/43]
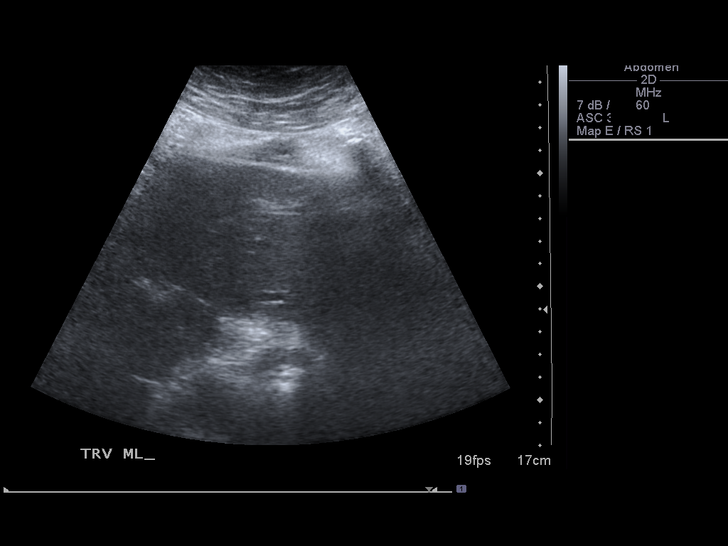
[im 11/43]
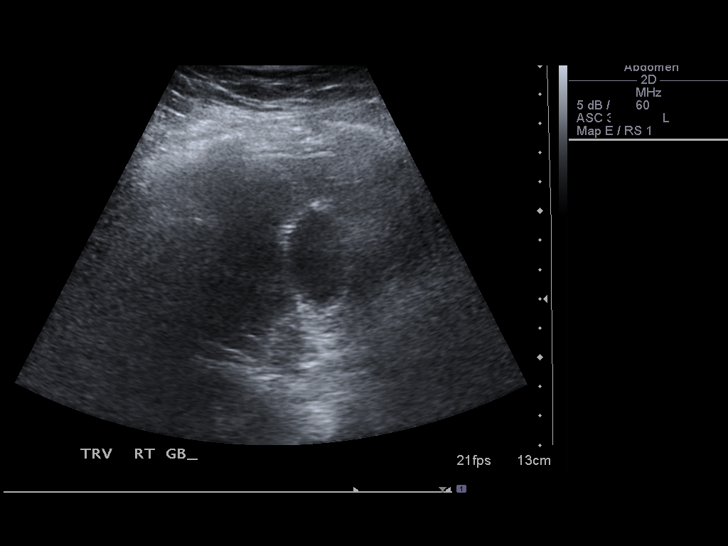
[im 15/43]
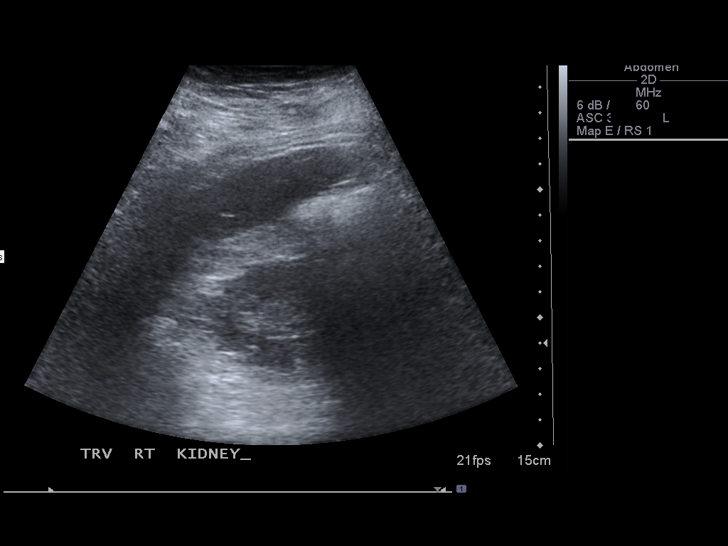
[im 16/43]
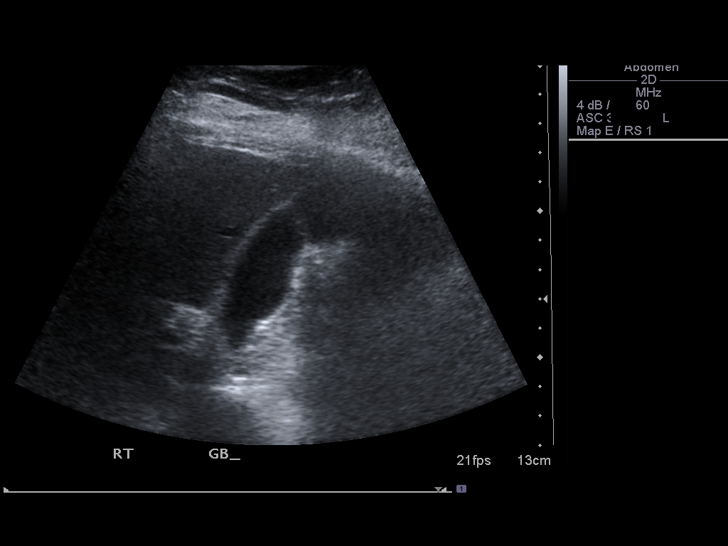
[im 20/43]
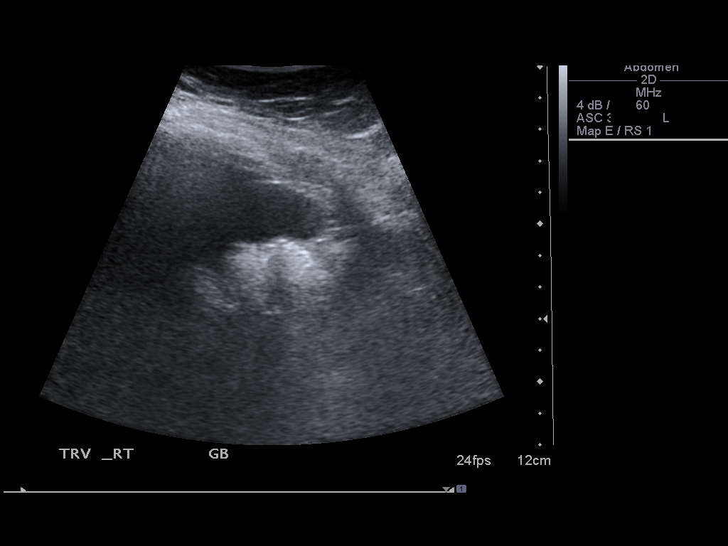
[im 23/43]
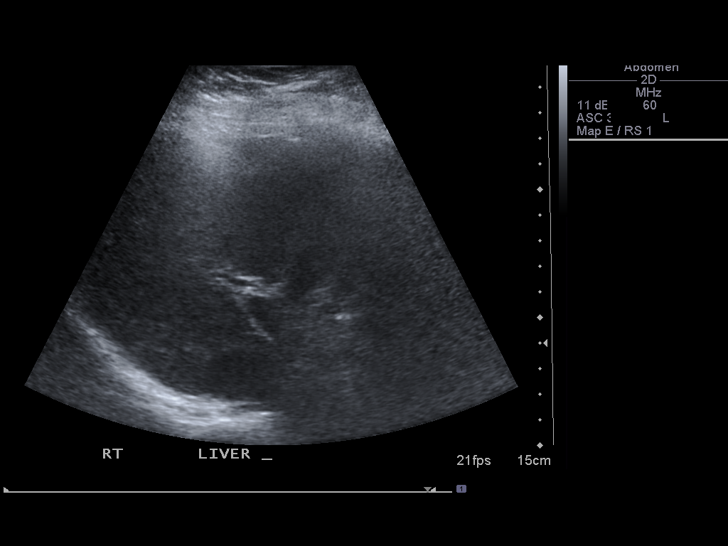
[im 27/43]
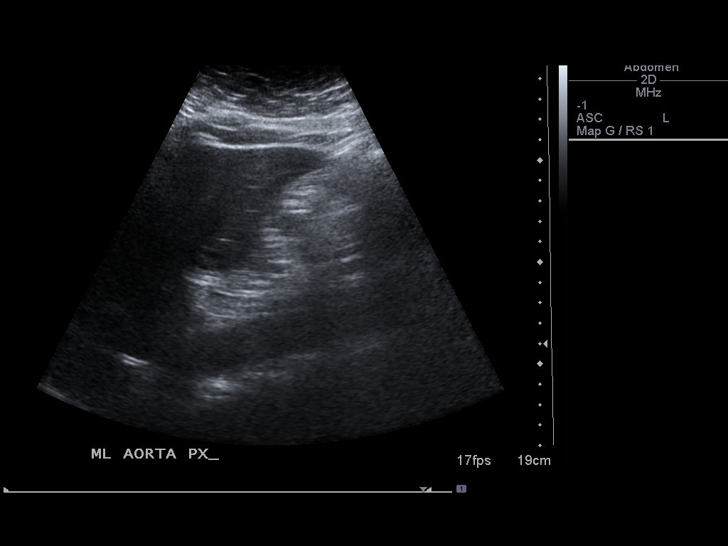
[im 29/43]
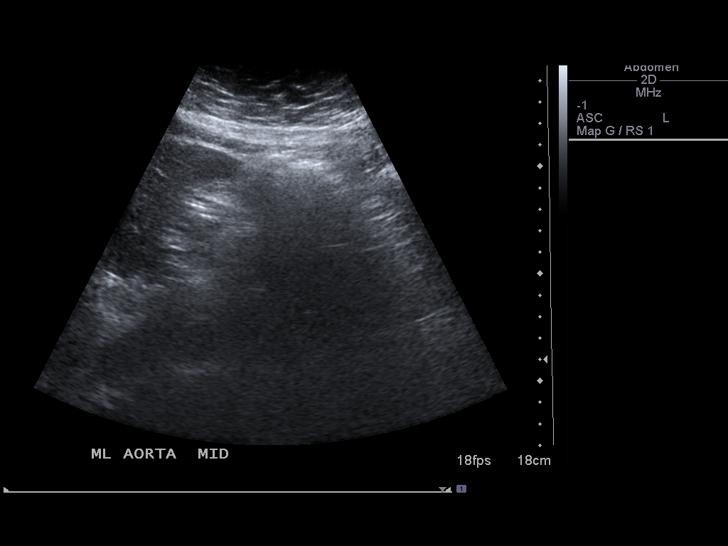
[im 32/43]
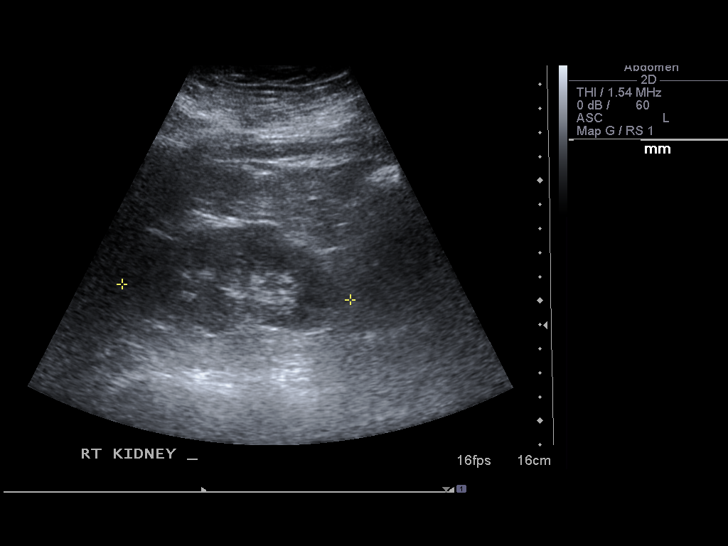
[im 36/43]
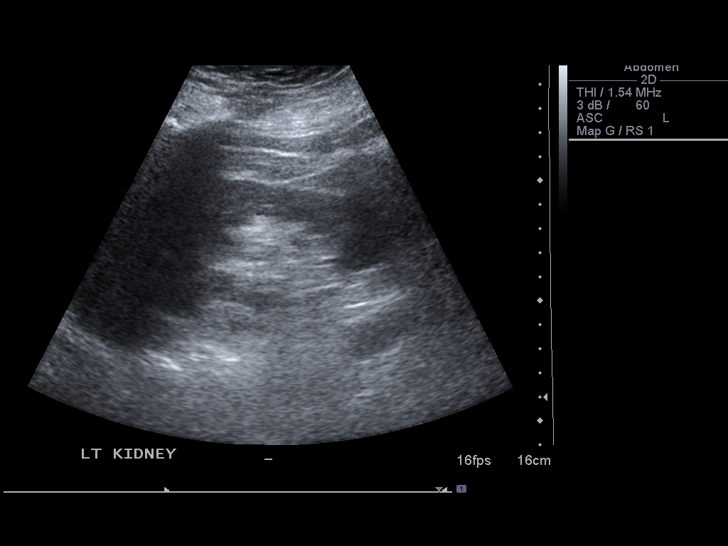
[im 39/43]
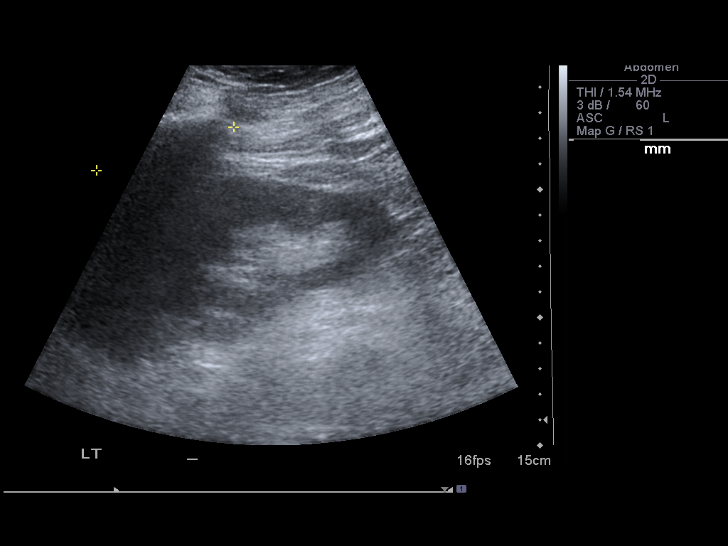
[im 43/43]
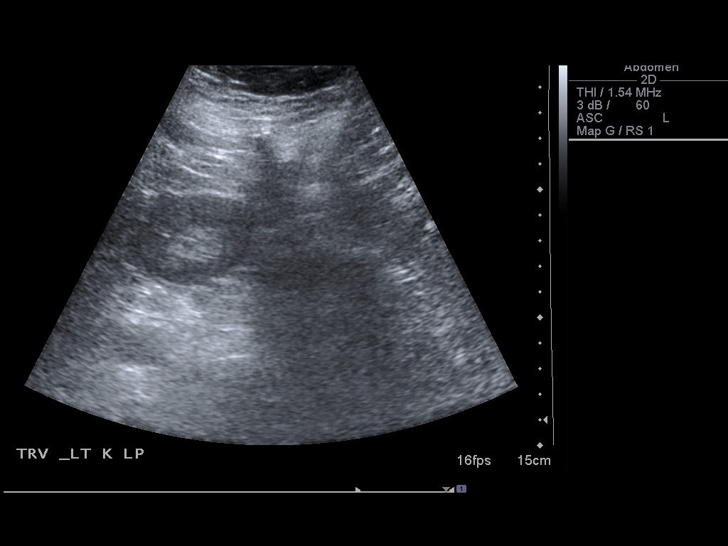

[14 of 25 positions shown; findings below may reference images not displayed]

FINDINGS: Gallbladder:  No gallstones, gallbladder wall thickening, or
pericholecystic fluid.

Common bile duct:  3 mm.  Normal.

Liver:  Mildly hypoechoic in exaggeration of the portal triads, but
no focal abnormality or intra or extrahepatic ductal dilatation.

IVC:  Obscured by bowel gas.

Pancreas:  Body normal appearance.  Tail obscured by bowel gas.
Head not well visualized due to bowel gas.

Spleen:  5.6 cm.  No focal abnormality.

Right Kidney:  9.5 cm. No hydronephrosis or focal mass.

Left Kidney:  9.5 cm. No hydronephrosis or focal mass.

Abdominal aorta:  Proximal aorta is normal.  Mid and distal aorta
obscured by bowel gas.
IMPRESSION: No acute abnormality.  The liver is decreased in echogenicity
overall without focal abnormality.  This is nonspecific but
occasionally may be seen with hepatitis.  Correlate with liver
function tests.

## 2012-03-05 IMAGING — CR DG CHEST 1V
1 series · 1 of 1 positions shown · non-contrast
Comparison: 08/29/2010

CLINICAL DATA: Altered mental status

CHEST - 1 VIEW

[view not recorded]
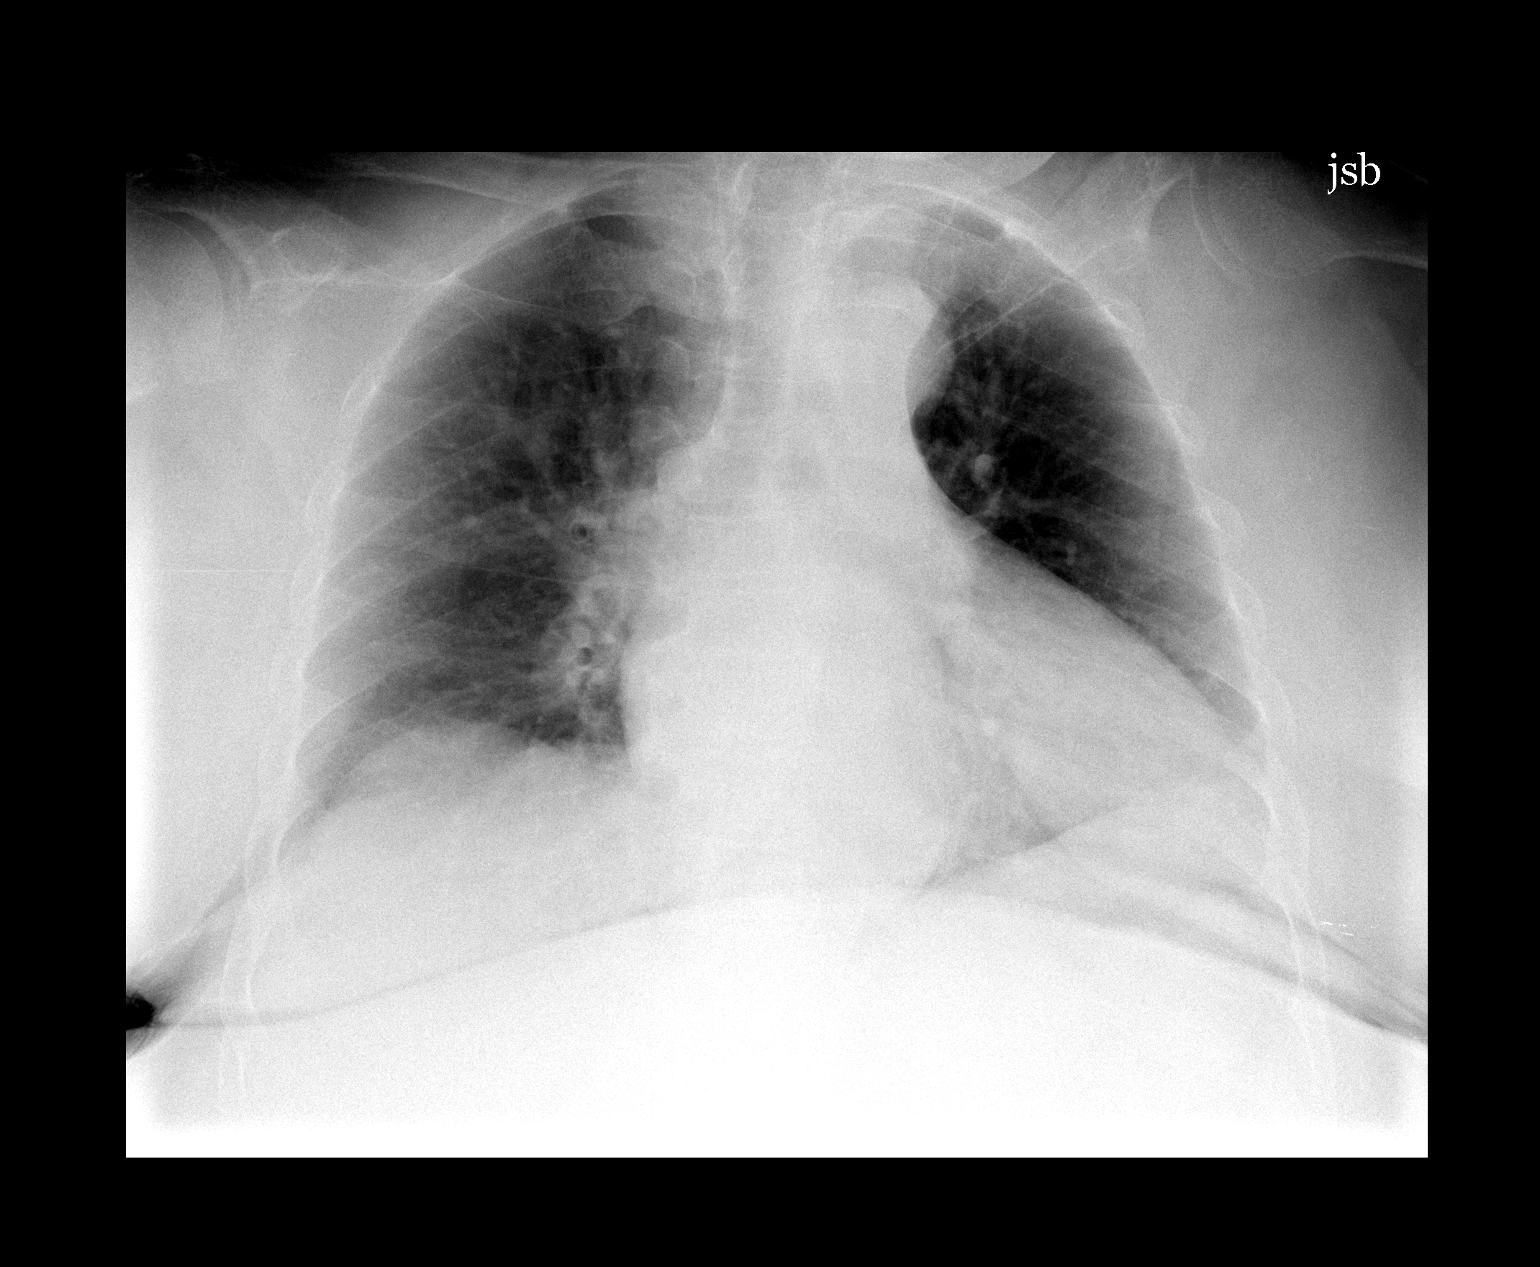

[1 of 1 positions shown; findings below may reference images not displayed]

FINDINGS: Lung volumes are low with crowding of the bronchovascular
markings.  Heart size upper limits of normal allowing technique.
No focal pulmonary opacity.  No pleural effusion.  Detail is
obscured by patient body habitus.
IMPRESSION: Low lung volumes with borderline enlargement cardiac silhouette but
no focal acute pulmonary opacity allowing for technique. If the
patient's symptoms continue, consider PA and lateral chest
radiographs obtained at full inspiration when the patient is
clinically able.

## 2020-06-23 ENCOUNTER — Emergency Department (HOSPITAL_COMMUNITY): Payer: Medicare Other

## 2020-06-23 ENCOUNTER — Other Ambulatory Visit: Payer: Self-pay

## 2020-06-23 ENCOUNTER — Encounter (HOSPITAL_COMMUNITY): Payer: Self-pay | Admitting: Emergency Medicine

## 2020-06-23 ENCOUNTER — Emergency Department (HOSPITAL_COMMUNITY)
Admission: EM | Admit: 2020-06-23 | Discharge: 2020-06-24 | Disposition: A | Discharge status: Home / Self Care | Payer: Medicare Other | Attending: Emergency Medicine | Admitting: Emergency Medicine

## 2020-06-23 DIAGNOSIS — I130 Hypertensive heart and chronic kidney disease with heart failure and stage 1 through stage 4 chronic kidney disease, or unspecified chronic kidney disease: Secondary | ICD-10-CM | POA: Insufficient documentation

## 2020-06-23 DIAGNOSIS — I509 Heart failure, unspecified: Secondary | ICD-10-CM | POA: Insufficient documentation

## 2020-06-23 DIAGNOSIS — E1165 Type 2 diabetes mellitus with hyperglycemia: Secondary | ICD-10-CM | POA: Diagnosis not present

## 2020-06-23 DIAGNOSIS — N189 Chronic kidney disease, unspecified: Secondary | ICD-10-CM | POA: Diagnosis not present

## 2020-06-23 DIAGNOSIS — E1122 Type 2 diabetes mellitus with diabetic chronic kidney disease: Secondary | ICD-10-CM | POA: Insufficient documentation

## 2020-06-23 DIAGNOSIS — R739 Hyperglycemia, unspecified: Secondary | ICD-10-CM

## 2020-06-23 HISTORY — DX: Type 2 diabetes mellitus without complications: E11.9

## 2020-06-23 LAB — CBG MONITORING, ED: Glucose-Capillary: 486 mg/dL — ABNORMAL HIGH (ref 70–99)

## 2020-06-23 LAB — BETA-HYDROXYBUTYRIC ACID: Beta-Hydroxybutyric Acid: 1.52 mmol/L — ABNORMAL HIGH (ref 0.05–0.27)

## 2020-06-23 MED ORDER — SODIUM CHLORIDE 0.9 % IV BOLUS
1000.0000 mL | Freq: Once | INTRAVENOUS | Status: AC
  Administered 2020-06-23: 1000 mL via INTRAVENOUS

## 2020-06-23 NOTE — ED Notes (Signed)
Pt placed on purewick and informed that we need a urine sample. Pt nodded to say that she understood

## 2020-06-23 NOTE — ED Triage Notes (Signed)
Pt from [facility]. Pt has hx of dementia, is alert but has incomprehensible speech at baseline. Pt has high blood sugar, 518 for EMS. Pt has sub-q line from the facility in her abdomen. Pt can nod or shake head to communicate

## 2020-06-23 NOTE — ED Provider Notes (Signed)
[facility]-EMERGENCY DEPT Provider Note   CSN: [id number] Arrival date & time: 06/23/20  2153     History Chief Complaint  Patient presents with   Hyperglycemia    Kristin Mccarty is a 84 y.o. female.  The history is provided by the patient and medical records.  Hyperglycemia Associated symptoms: polyuria     84 y.o. F with hx of CHF, HTN, HLP, DM2, presenting to the ED with hyperglycemia.  Patient is on daily insulin therapy, has sub-q line in abdomen for daily injections.  She has been compliant with medications.  No recent changes in diet.  No fever/chills/recent illness.  No cough, chest pain, or SOB. Does report urinary frequency but denies dysuria/hematuria. No abdominal pain or flank pain. CBG 518 for EMS.  Past Medical History:  Diagnosis Date   Diabetes mellitus without complication [facility])     Patient Active Problem List   Diagnosis Date Noted   HYPERLIPIDEMIA 12/29/2009   DEHYDRATION 12/27/2009   HYPERTENSION 12/27/2009   CHF 12/27/2009   RENAL INSUFFICIENCY, CHRONIC 12/27/2009   SKIN RASH 12/27/2009   UTI'S, HX OF 12/27/2009    History reviewed. No pertinent surgical history.   OB History   No obstetric history on file.     No family history on file.  Social History   Tobacco Use   Smoking status: Never Smoker   Smokeless tobacco: Never Used  Substance Use Topics   Alcohol use: Not Currently   Drug use: Not Currently    Home Medications Prior to Admission medications   Not on File    Allergies    Patient has no allergy information on record.  Review of Systems   Review of Systems  Endocrine: Positive for polyuria.       Hyperglycemia  All other systems reviewed and are negative.   Physical Exam Updated Vital Signs BP 90/63    Pulse 94    Temp 97.9 F (36.6 C) (Oral)    Resp 17    SpO2 97%   Physical Exam Vitals and nursing note reviewed.  Constitutional:      Appearance: She is well-developed.       Comments: Elderly, clinically dry  HENT:     Head: Normocephalic and atraumatic.  Eyes:     Conjunctiva/sclera: Conjunctivae normal.     Pupils: Pupils are equal, round, and reactive to light.  Cardiovascular:     Rate and Rhythm: Normal rate and regular rhythm.     Heart sounds: Normal heart sounds.  Pulmonary:     Effort: Pulmonary effort is normal.     Breath sounds: Normal breath sounds. No stridor. No wheezing or rhonchi.     Comments: Lungs clear, NAD Abdominal:     General: Bowel sounds are normal.     Palpations: Abdomen is soft.     Tenderness: There is no abdominal tenderness. There is no rebound.     Comments: Soft, non-tender  Musculoskeletal:        General: Normal range of motion.     Cervical back: Normal range of motion.  Skin:    General: Skin is warm and dry.  Neurological:     Mental Status: She is alert.     Comments: Awake, alert, oriented to situations, answers yes or no questions by shaking head but difficulty with verbal responses which is baseline per facility     ED Results / Procedures / Treatments   Labs (all labs ordered are listed, but only  abnormal results are displayed) Labs Reviewed  BETA-HYDROXYBUTYRIC ACID - Abnormal; Notable for the following components:      Result Value   Beta-Hydroxybutyric Acid 1.52 (*)    All other components within normal limits  URINALYSIS, ROUTINE W REFLEX MICROSCOPIC - Abnormal; Notable for the following components:   Glucose, UA >=500 (*)    Ketones, ur 5 (*)    Leukocytes,Ua SMALL (*)    Bacteria, UA RARE (*)    All other components within normal limits  BLOOD GAS, VENOUS - Abnormal; Notable for the following components:   pCO2, Ven 41.9 (*)    pO2, Ven 73.4 (*)    Acid-base deficit 4.3 (*)    All other components within normal limits  BASIC METABOLIC PANEL - Abnormal; Notable for the following components:   Sodium 148 (*)    Chloride 117 (*)    CO2 20 (*)    Glucose, Bld 566 (*)    BUN 69 (*)     Creatinine, Ser 1.46 (*)    GFR calc non Af Amer 33 (*)    GFR calc Af Amer 38 (*)    All other components within normal limits  CBC WITH DIFFERENTIAL/PLATELET - Abnormal; Notable for the following components:   WBC 13.2 (*)    RBC 5.87 (*)    Hemoglobin 15.1 (*)    HCT 48.5 (*)    MCH 25.7 (*)    RDW 16.5 (*)    Platelets 73 (*)    Neutro Abs 9.7 (*)    All other components within normal limits  CBG MONITORING, ED - Abnormal; Notable for the following components:   Glucose-Capillary 486 (*)    All other components within normal limits  CBG MONITORING, ED - Abnormal; Notable for the following components:   Glucose-Capillary 449 (*)    All other components within normal limits  CBG MONITORING, ED - Abnormal; Notable for the following components:   Glucose-Capillary 398 (*)    All other components within normal limits  CBC WITH DIFFERENTIAL/PLATELET    EKG None  Radiology No results found.  Procedures Procedures (including critical care time)  Medications Ordered in ED Medications  sodium chloride 0.9 % bolus 1,000 mL (0 mLs Intravenous Stopped 06/24/20 0003)  insulin aspart (novoLOG) injection 12 Units (12 Units Intravenous Given 06/24/20 0243)  sodium chloride 0.9 % bolus 500 mL (0 mLs Intravenous Stopped 06/24/20 0429)    ED Course  I have reviewed the triage vital signs and the nursing notes.  Pertinent labs & imaging results that were available during my care of the patient were reviewed by me and considered in my medical decision making (see chart for details).    MDM Rules/Calculators/A&P  84 y.o. F here with hyperglycemia.  CBG 518 with EMS.  She is afebrile, non-toxic.  Does appear somewhat dry.  Reports urinary frequency but denies abdominal pain, flank pain, nausea, vomiting, fever, chills, cough, chest pain, or SOB.  Labs pending.  IVF started.  Delay in blood work due to clotted samples x2.  Phlebotomy from lab to come attempt.  IVF infusing.  Labs as  above-- glucose 566.  Anion gap remains normal at 11, CO2 20, normal pH.  B-hydroxy mildly elevated but other findings are not clinically not consistent with DKA.  Given additional IVF and insulin.  CBG improved after IV insulin, now below 400.  Suspect this will continue to downtrend.  Awaiting UA.  UA without signs of infection.  Patient resting comfortably.  Will d/c back to facility.  Continue sliding scale, carb/sugar modified diet, push oral fluids.  Close follow-up with PCP.  Return here for any new/acute changes.  Final Clinical Impression(s) / ED Diagnoses Final diagnoses:  Hyperglycemia    Rx / DC Orders ED Discharge Orders    None       Garlon Hatchet, PA-C 06/24/20 0602    Lorre Nick, MD 06/26/20 [phone]

## 2020-06-23 NOTE — ED Provider Notes (Signed)
Medical screening examination/treatment/procedure(s) were conducted as a shared visit with non-physician practitioner(s) and myself.  I personally evaluated the patient during the encounter.    84 year old female with history dementia presents with high blood sugar at the facility.  Sugar was 518.  Will check labs and give saline and likely insulin   Lorre Nick, MD 06/23/20 2323

## 2020-06-24 DIAGNOSIS — E1165 Type 2 diabetes mellitus with hyperglycemia: Secondary | ICD-10-CM | POA: Diagnosis not present

## 2020-06-24 LAB — BASIC METABOLIC PANEL
Anion gap: 11 (ref 5–15)
BUN: 69 mg/dL — ABNORMAL HIGH (ref 8–23)
CO2: 20 mmol/L — ABNORMAL LOW (ref 22–32)
Calcium: 9.3 mg/dL (ref 8.9–10.3)
Chloride: 117 mmol/L — ABNORMAL HIGH (ref 98–111)
Creatinine, Ser: 1.46 mg/dL — ABNORMAL HIGH (ref 0.44–1.00)
GFR calc Af Amer: 38 mL/min — ABNORMAL LOW (ref >60)
GFR calc non Af Amer: 33 mL/min — ABNORMAL LOW (ref >60)
Glucose, Bld: 566 mg/dL (ref 70–99)
Potassium: 4.3 mmol/L (ref 3.5–5.1)
Sodium: 148 mmol/L — ABNORMAL HIGH (ref 135–145)

## 2020-06-24 LAB — CBC WITH DIFFERENTIAL/PLATELET
Abs Immature Granulocytes: 0.06 10*3/uL (ref 0.00–0.07)
Basophils Absolute: 0.1 10*3/uL (ref 0.0–0.1)
Basophils Relative: 0 %
Eosinophils Absolute: 0.2 10*3/uL (ref 0.0–0.5)
Eosinophils Relative: 2 %
HCT: 48.5 % — ABNORMAL HIGH (ref 36.0–46.0)
Hemoglobin: 15.1 g/dL — ABNORMAL HIGH (ref 12.0–15.0)
Immature Granulocytes: 1 %
Lymphocytes Relative: 19 %
Lymphs Abs: 2.5 10*3/uL (ref 0.7–4.0)
MCH: 25.7 pg — ABNORMAL LOW (ref 26.0–34.0)
MCHC: 31.1 g/dL (ref 30.0–36.0)
MCV: 82.6 fL (ref 80.0–100.0)
Monocytes Absolute: 0.7 10*3/uL (ref 0.1–1.0)
Monocytes Relative: 5 %
Neutro Abs: 9.7 10*3/uL — ABNORMAL HIGH (ref 1.7–7.7)
Neutrophils Relative %: 73 %
Platelets: 73 10*3/uL — ABNORMAL LOW (ref 150–400)
RBC: 5.87 MIL/uL — ABNORMAL HIGH (ref 3.87–5.11)
RDW: 16.5 % — ABNORMAL HIGH (ref 11.5–15.5)
WBC: 13.2 10*3/uL — ABNORMAL HIGH (ref 4.0–10.5)
nRBC: 0 % (ref 0.0–0.2)

## 2020-06-24 LAB — BLOOD GAS, VENOUS
Acid-base deficit: 4.3 mmol/L — ABNORMAL HIGH (ref 0.0–2.0)
Bicarbonate: 21.1 mmol/L (ref 20.0–28.0)
O2 Saturation: 93.6 %
Patient temperature: 98.6
pCO2, Ven: 41.9 mmHg — ABNORMAL LOW (ref 44.0–60.0)
pH, Ven: 7.323 (ref 7.250–7.430)
pO2, Ven: 73.4 mmHg — ABNORMAL HIGH (ref 32.0–45.0)

## 2020-06-24 LAB — URINALYSIS, ROUTINE W REFLEX MICROSCOPIC
Bilirubin Urine: NEGATIVE
Glucose, UA: >=500 mg/dL — AB
Hgb urine dipstick: NEGATIVE
Ketones, ur: 5 mg/dL — AB
Nitrite: NEGATIVE
Protein, ur: NEGATIVE mg/dL
Specific Gravity, Urine: 1.029 (ref 1.005–1.030)
pH: 5 (ref 5.0–8.0)

## 2020-06-24 LAB — CBG MONITORING, ED
Glucose-Capillary: 449 mg/dL — ABNORMAL HIGH (ref 70–99)
Glucose-Capillary: 398 mg/dL — ABNORMAL HIGH (ref 70–99)

## 2020-06-24 MED ORDER — INSULIN ASPART 100 UNIT/ML ~~LOC~~ SOLN
12.0000 [IU] | Freq: Once | SUBCUTANEOUS | Status: AC
Start: 2020-06-24 — End: 2020-06-24
  Administered 2020-06-24: 12 [IU] via INTRAVENOUS
  Filled 2020-06-24: qty 0.12

## 2020-06-24 MED ORDER — SODIUM CHLORIDE 0.9 % IV BOLUS
500.0000 mL | Freq: Once | INTRAVENOUS | Status: AC
  Administered 2020-06-24: 500 mL via INTRAVENOUS

## 2020-06-24 NOTE — Discharge Instructions (Signed)
Blood sugar elevated today but no signs of DKA.  Given IVF and insulin today, blood sugar should continue trending down. Continue good oral hydration and sliding scale insulin, watch sugar and carbs. Follow-up with your primary care doctor. Return here for new concerns.

## 2020-06-24 NOTE — ED Notes (Signed)
Attempted to call report to [facility], no answer

## 2020-06-24 NOTE — ED Notes (Signed)
Date and time results received: 06/24/20 2:33 AM  Test: Glucose Critical Value: 566  Name of Provider Notified: Delo, EDP

## 2020-07-05 ENCOUNTER — Emergency Department (HOSPITAL_COMMUNITY): Payer: Medicare Other

## 2020-07-05 ENCOUNTER — Emergency Department (HOSPITAL_COMMUNITY)
Admission: EM | Admit: 2020-07-05 | Discharge: 2020-07-06 | Disposition: A | Discharge status: Home / Self Care | Payer: Medicare Other | Attending: Emergency Medicine | Admitting: Emergency Medicine

## 2020-07-05 DIAGNOSIS — Z79899 Other long term (current) drug therapy: Secondary | ICD-10-CM | POA: Diagnosis not present

## 2020-07-05 DIAGNOSIS — I509 Heart failure, unspecified: Secondary | ICD-10-CM | POA: Insufficient documentation

## 2020-07-05 DIAGNOSIS — I110 Hypertensive heart disease with heart failure: Secondary | ICD-10-CM | POA: Diagnosis not present

## 2020-07-05 DIAGNOSIS — Z794 Long term (current) use of insulin: Secondary | ICD-10-CM | POA: Diagnosis not present

## 2020-07-05 DIAGNOSIS — R4182 Altered mental status, unspecified: Secondary | ICD-10-CM | POA: Diagnosis present

## 2020-07-05 DIAGNOSIS — E119 Type 2 diabetes mellitus without complications: Secondary | ICD-10-CM | POA: Diagnosis not present

## 2020-07-05 DIAGNOSIS — Z7982 Long term (current) use of aspirin: Secondary | ICD-10-CM | POA: Insufficient documentation

## 2020-07-05 DIAGNOSIS — N390 Urinary tract infection, site not specified: Secondary | ICD-10-CM | POA: Diagnosis not present

## 2020-07-05 LAB — CBC WITH DIFFERENTIAL/PLATELET
Abs Immature Granulocytes: 0.13 10*3/uL — ABNORMAL HIGH (ref 0.00–0.07)
Basophils Absolute: 0 10*3/uL (ref 0.0–0.1)
Basophils Relative: 0 %
Eosinophils Absolute: 0.2 10*3/uL (ref 0.0–0.5)
Eosinophils Relative: 2 %
HCT: 43.7 % (ref 36.0–46.0)
Hemoglobin: 13.9 g/dL (ref 12.0–15.0)
Immature Granulocytes: 1 %
Lymphocytes Relative: 29 %
Lymphs Abs: 3.3 10*3/uL (ref 0.7–4.0)
MCH: 25.6 pg — ABNORMAL LOW (ref 26.0–34.0)
MCHC: 31.8 g/dL (ref 30.0–36.0)
MCV: 80.3 fL (ref 80.0–100.0)
Monocytes Absolute: 0.7 10*3/uL (ref 0.1–1.0)
Monocytes Relative: 6 %
Neutro Abs: 7 10*3/uL (ref 1.7–7.7)
Neutrophils Relative %: 62 %
Platelets: 108 10*3/uL — ABNORMAL LOW (ref 150–400)
RBC: 5.44 MIL/uL — ABNORMAL HIGH (ref 3.87–5.11)
RDW: 15.7 % — ABNORMAL HIGH (ref 11.5–15.5)
WBC: 11.5 10*3/uL — ABNORMAL HIGH (ref 4.0–10.5)
nRBC: 0 % (ref 0.0–0.2)

## 2020-07-05 LAB — URINALYSIS, ROUTINE W REFLEX MICROSCOPIC
Bilirubin Urine: NEGATIVE
Glucose, UA: NEGATIVE mg/dL
Hgb urine dipstick: NEGATIVE
Ketones, ur: NEGATIVE mg/dL
Nitrite: NEGATIVE
Protein, ur: NEGATIVE mg/dL
Specific Gravity, Urine: 1.011 (ref 1.005–1.030)
WBC, UA: >50 WBC/hpf — ABNORMAL HIGH (ref 0–5)
pH: 5 (ref 5.0–8.0)

## 2020-07-05 LAB — COMPREHENSIVE METABOLIC PANEL
ALT: 27 U/L (ref 0–44)
AST: 31 U/L (ref 15–41)
Albumin: 2.3 g/dL — ABNORMAL LOW (ref 3.5–5.0)
Alkaline Phosphatase: 78 U/L (ref 38–126)
Anion gap: 11 (ref 5–15)
BUN: 15 mg/dL (ref 8–23)
CO2: 20 mmol/L — ABNORMAL LOW (ref 22–32)
Calcium: 8.6 mg/dL — ABNORMAL LOW (ref 8.9–10.3)
Chloride: 104 mmol/L (ref 98–111)
Creatinine, Ser: 1.07 mg/dL — ABNORMAL HIGH (ref 0.44–1.00)
GFR, Estimated: 48 mL/min — ABNORMAL LOW (ref >60)
Glucose, Bld: 208 mg/dL — ABNORMAL HIGH (ref 70–99)
Potassium: 4.4 mmol/L (ref 3.5–5.1)
Sodium: 135 mmol/L (ref 135–145)
Total Bilirubin: 1.3 mg/dL — ABNORMAL HIGH (ref 0.3–1.2)
Total Protein: 5.8 g/dL — ABNORMAL LOW (ref 6.5–8.1)

## 2020-07-05 LAB — LACTIC ACID, PLASMA: Lactic Acid, Venous: 1.7 mmol/L (ref 0.5–1.9)

## 2020-07-05 LAB — CBG MONITORING, ED: Glucose-Capillary: 195 mg/dL — ABNORMAL HIGH (ref 70–99)

## 2020-07-05 MED ORDER — CEPHALEXIN 500 MG PO CAPS: 500.0000 mg | ORAL_CAPSULE | Freq: Four times a day (QID) | ORAL | 0 refills | Status: DC

## 2020-07-05 MED ORDER — SODIUM CHLORIDE 0.9 % IV SOLN
2.0000 g | Freq: Once | INTRAVENOUS | Status: AC
  Administered 2020-07-05: 2 g via INTRAVENOUS
  Filled 2020-07-05: qty 20

## 2020-07-05 MED ORDER — SODIUM CHLORIDE 0.9 % IV SOLN: Freq: Once | INTRAVENOUS | Status: AC

## 2020-07-05 NOTE — Progress Notes (Signed)
PIV consult: Arrived to ED, site already established. Cancel consult.

## 2020-07-05 NOTE — ED Provider Notes (Signed)
[facility] EMERGENCY DEPARTMENT Provider Note   CSN: [id number] Arrival date & time: 07/05/20  1756     History No chief complaint on file.   Kristin Mccarty is a 84 y.o. female history of diabetes, hyperlipidemia, hypertension, CHF, obesity.  Patient arrives via EMS from [facility] health care center for altered mental status, some history of hyperglycemia but no other helpful history obtained. - Patient reports that she is feeling well, she answers yes/no questions and follows commands appropriately, speech is almost incomprehensible, she is able to articulate her name, yes/no and that she is not in any pain and she feels well. - Additional history obtained from [facility] health care center [employer].  Per Herbert Seta patient has had altered mental status since June 24, 2020, patient had gradually worsening slurred speech, confusion and getting lost while walking around the facility.  Of note patient normally walks with a walker.  They report patient was sent to the ER and received insulin and had gradual improvement of symptoms since discharge.  However this morning when patient was walking around she seemed more confused than normal, she reported that patient speech was incomprehensible when normally she is able to communicate verbally but has "delayed speech normally".  She advises that the garbled speech today is new over the last few weeks.  She does report that patient had a mechanical fall without injury on June 28, 2020.  Patient is not on any blood thinners.   HPI     Past Medical History:  Diagnosis Date  . Diabetes mellitus without complication [facility])     Patient Active Problem List   Diagnosis Date Noted  . HYPERLIPIDEMIA 12/29/2009  . DEHYDRATION 12/27/2009  . HYPERTENSION 12/27/2009  . CHF 12/27/2009  . RENAL INSUFFICIENCY, CHRONIC 12/27/2009  . SKIN RASH 12/27/2009  . UTI'S, HX OF 12/27/2009    No past surgical history on file.   OB History     No obstetric history on file.     No family history on file.  Social History   Tobacco Use  . Smoking status: Never Smoker  . Smokeless tobacco: Never Used  Substance Use Topics  . Alcohol use: Not Currently  . Drug use: Not Currently    Home Medications Prior to Admission medications   Medication Sig Start Date End Date Taking? Authorizing Provider  acetaminophen (TYLENOL) 650 MG suppository Place 650 mg rectally every 4 (four) hours as needed for moderate pain.    [provider]  amLODipine (NORVASC) 10 MG tablet Take 10 mg by mouth daily.    [provider]  aspirin EC 81 MG tablet Take 81 mg by mouth daily. Swallow whole.    [provider]  atorvastatin (LIPITOR) 40 MG tablet Take 40 mg by mouth daily.    [provider]  cholecalciferol (VITAMIN D3) 25 MCG (1000 UNIT) tablet Take 1,000 Units by mouth daily.    [provider]  Dulaglutide (TRULICITY) 0.75 MG/0.5ML SOPN Inject 0.75 mg into the skin every 7 (seven) days. TUESDAY    [provider]  enalapril (VASOTEC) 10 MG tablet Take 10 mg by mouth 2 (two) times daily.    [provider]  ferrous sulfate 325 (65 FE) MG tablet Take 325 mg by mouth daily with breakfast.    [provider]  folic acid (FOLVITE) 1 MG tablet Take 1 mg by mouth daily.    [provider]  insulin aspart (NOVOLOG) 100 UNIT/ML injection Inject 15 Units  into the skin 3 (three) times daily before meals. Sliding scale increased by 2 units   151-199, 200-249, 250-299, 300-349, 350-399    [provider]  loperamide (IMODIUM) 2 MG capsule Take 2 mg by mouth as needed for diarrhea or loose stools.    [provider]  Multiple Vitamins-Minerals (MULTIVITAMIN WITH MINERALS) tablet Take 1 tablet by mouth daily.    [provider]    Allergies    Patient has no allergy information on record.  Review of Systems   Review of Systems  Unable to perform  ROS: Mental status change    Physical Exam Updated Vital Signs There were no vitals taken for this visit.  Physical Exam Constitutional:      General: She is not in acute distress.    Appearance: Normal appearance. She is well-developed. She is not ill-appearing or diaphoretic.  HENT:     Head: Normocephalic and atraumatic.  Eyes:     General: Vision grossly intact. Gaze aligned appropriately.     Pupils: Pupils are equal, round, and reactive to light.  Neck:     Trachea: Trachea and phonation normal.  Pulmonary:     Effort: Pulmonary effort is normal. No respiratory distress.  Abdominal:     General: There is no distension.     Palpations: Abdomen is soft.     Tenderness: There is no abdominal tenderness. There is no guarding or rebound.  Musculoskeletal:        General: Normal range of motion.     Cervical back: Normal range of motion.  Skin:    General: Skin is warm and dry.  Neurological:     Mental Status: She is alert.     GCS: GCS eye subscore is 4. GCS verbal subscore is 5. GCS motor subscore is 6.     Comments: Speech is mostly incomprehensible does verbalize yes and no to questions.  Follows commands appropriately, pleasant and interactive. Major Cranial nerves without deficit, no facial droop Intact and equal strength of bilateral upper extremities.  Patient with intact and equal dorsi/plantar flexion  Psychiatric:        Behavior: Behavior normal.     ED Results / Procedures / Treatments   Labs (all labs ordered are listed, but only abnormal results are displayed) Labs Reviewed  CBG MONITORING, ED - Abnormal; Notable for the following components:      Result Value   Glucose-Capillary 195 (*)    All other components within normal limits    EKG None  Radiology No results found.  Procedures Procedures (including critical care time)  Medications Ordered in ED Medications - No data to display  ED Course  I have reviewed the triage vital signs and  the nursing notes.  Pertinent labs & imaging results that were available during my care of the patient were reviewed by me and considered in my medical decision making (see chart for details).  Clinical Course as of Jul 06 1823  Caleen Essex Jul 05, 2020  1816 Herbert Seta RN [phone]  [phone]   [BM]    Clinical Course User Index [BM] Elizabeth Palau   MDM Rules/Calculators/A&P                         Additional history obtained from: 1. Nursing notes from this visit. 2. Nursing facility. 3. Electronic medical record reviewed.  Patient seen on 07/20/2020 for hyperglycemia.  Patient did not appear to be in DKA,  she was given IV fluids and insulin with improvement of labs.  No evidence of infection.  She was discharged with PCP follow-up.  Reviewed neurologic exam from that visit patient appeared to be able to answer yes/no questions by shaking head but had difficult verbal response which appears similar to today's exam. ------------------------------- 84 year old female history above presents with what appears to be acute worsening of confusion and speech that has been ongoing for the past 2 weeks.  Noted to be hypoglycemic by facility also had a fall 1 week ago not on blood thinners.  I have ordered labs including CBC, CMP, CBG, lactic, urinalysis and beta hydroxybutyrate acid. Ordered chest x-ray as well as CT head and EKG.  Maintenance fluids ordered.  Patient afebrile without tachycardia low suspicion for infection at this time.  Patient following commands appropriately, no cranial nerve deficits, strength equal bilaterally. - CBG resulted 195.  Remainder of studies are pending.  Care handoff given to Trisha Mangle, PA-C at shift change.  Plan of care is to follow-up on pending studies, reassessment, final disposition per oncoming team.   Note: Portions of this report may have been transcribed using voice recognition software. Every effort was made to ensure accuracy; however, inadvertent  computerized transcription errors may still be present. Final Clinical Impression(s) / ED Diagnoses Final diagnoses:  None    Rx / DC Orders ED Discharge Orders    None       Elizabeth Palau 07/05/20 1837    Margarita Grizzle, MD 07/06/20 0003

## 2020-07-05 NOTE — ED Provider Notes (Addendum)
Pt's care assumed at 7p.   Ct scan shows no acute changes.  Pt answers fine when asked how she is doing.  Pt does not answer other questions. Vitals stable Ct head no acute abnormality  Ua pending  Pt's care turned over to Dr. Rosalia Hammers at 10 pm    Osie Cheeks 07/05/20 2226    Elson Areas, PA-C 07/05/20 2255    Margarita Grizzle, MD 07/06/20 0003

## 2020-07-05 NOTE — ED Triage Notes (Signed)
Pt BIB [facility] from [facility]. [facility] called because pt was "altered" and couldn't explain further to EMS. Pt is AOx2 at baseline to person and place and was AOx2 with EMS. Pt has incomprehensible speech. CBG in EMS was 296, BP=148/74, O2=97% on RA, NSRs in the 70s.  EMS also reported that pt had CBC done a week ago, slightly elevated WBC and UA was negative. Reported that increase in WBC is unknown.

## 2020-07-05 NOTE — ED Provider Notes (Signed)
84 yo female history of dementia with some worsening confusion.   Here exam appears consistent with reported dementia without focal findings. Work up with labs and head ct without cause of worsening . Urinalysis pending. Plan d/c back to [facility] as patient stable after urine reported. Patient with greater than 50 white blood cells on urinalysis. Rocephin IV ordered.  Plan Keflex as outpatient.  Patient is discharged back to nursing home with return precautions.    Margarita Grizzle, MD 07/05/20 [phone]

## 2020-07-05 NOTE — Discharge Instructions (Addendum)
Patient presents since today with urinary tract infection.  She is given 2 g of Rocephin here in the emergency department She will be on Keflex.  Please make sure she takes all of her antibiotics. She should be reevaluated if she begins having a fever, is not tolerating medications by mouth, or otherwise as worsening condition.

## 2020-07-06 LAB — URINE CULTURE: Culture: 10000 — AB

## 2020-07-06 NOTE — ED Notes (Signed)
Report called to [facility] at [facility]

## 2020-07-06 NOTE — ED Notes (Signed)
[facility] called to transport pt back to [facility]

## 2024-01-15 ENCOUNTER — Emergency Department (HOSPITAL_COMMUNITY)

## 2024-01-15 ENCOUNTER — Inpatient Hospital Stay (HOSPITAL_COMMUNITY)
Admission: EM | Admit: 2024-01-15 | Discharge: 2024-01-19 | DRG: 481 | Disposition: A | Discharge status: Skilled Nursing Facility | Source: Skilled Nursing Facility | Attending: Internal Medicine | Admitting: Internal Medicine

## 2024-01-15 ENCOUNTER — Encounter (HOSPITAL_COMMUNITY): Payer: Self-pay

## 2024-01-15 DIAGNOSIS — G2401 Drug induced subacute dyskinesia: Secondary | ICD-10-CM | POA: Diagnosis present

## 2024-01-15 DIAGNOSIS — I110 Hypertensive heart disease with heart failure: Secondary | ICD-10-CM | POA: Diagnosis not present

## 2024-01-15 DIAGNOSIS — R296 Repeated falls: Secondary | ICD-10-CM | POA: Diagnosis present

## 2024-01-15 DIAGNOSIS — S72141A Displaced intertrochanteric fracture of right femur, initial encounter for closed fracture: Principal | ICD-10-CM | POA: Diagnosis present

## 2024-01-15 DIAGNOSIS — W1830XA Fall on same level, unspecified, initial encounter: Secondary | ICD-10-CM | POA: Diagnosis present

## 2024-01-15 DIAGNOSIS — D62 Acute posthemorrhagic anemia: Secondary | ICD-10-CM | POA: Diagnosis not present

## 2024-01-15 DIAGNOSIS — Z993 Dependence on wheelchair: Secondary | ICD-10-CM | POA: Diagnosis not present

## 2024-01-15 DIAGNOSIS — W19XXXA Unspecified fall, initial encounter: Secondary | ICD-10-CM

## 2024-01-15 DIAGNOSIS — M810 Age-related osteoporosis without current pathological fracture: Secondary | ICD-10-CM | POA: Diagnosis present

## 2024-01-15 DIAGNOSIS — F0394 Unspecified dementia, unspecified severity, with anxiety: Secondary | ICD-10-CM | POA: Diagnosis present

## 2024-01-15 DIAGNOSIS — Z79899 Other long term (current) drug therapy: Secondary | ICD-10-CM | POA: Diagnosis not present

## 2024-01-15 DIAGNOSIS — D509 Iron deficiency anemia, unspecified: Secondary | ICD-10-CM | POA: Diagnosis present

## 2024-01-15 DIAGNOSIS — Z66 Do not resuscitate: Secondary | ICD-10-CM | POA: Diagnosis present

## 2024-01-15 DIAGNOSIS — E119 Type 2 diabetes mellitus without complications: Secondary | ICD-10-CM | POA: Diagnosis present

## 2024-01-15 DIAGNOSIS — I509 Heart failure, unspecified: Secondary | ICD-10-CM | POA: Diagnosis not present

## 2024-01-15 DIAGNOSIS — Z8659 Personal history of other mental and behavioral disorders: Secondary | ICD-10-CM

## 2024-01-15 DIAGNOSIS — E785 Hyperlipidemia, unspecified: Secondary | ICD-10-CM | POA: Diagnosis not present

## 2024-01-15 LAB — CBC WITH DIFFERENTIAL/PLATELET
Abs Immature Granulocytes: 0.04 10*3/uL (ref 0.00–0.07)
Basophils Absolute: 0 10*3/uL (ref 0.0–0.1)
Basophils Relative: 0 %
Eosinophils Absolute: 0 10*3/uL (ref 0.0–0.5)
Eosinophils Relative: 0 %
HCT: 35.9 % — ABNORMAL LOW (ref 36.0–46.0)
Hemoglobin: 11 g/dL — ABNORMAL LOW (ref 12.0–15.0)
Immature Granulocytes: 0 %
Lymphocytes Relative: 11 %
Lymphs Abs: 1.3 10*3/uL (ref 0.7–4.0)
MCH: 25.8 pg — ABNORMAL LOW (ref 26.0–34.0)
MCHC: 30.6 g/dL (ref 30.0–36.0)
MCV: 84.3 fL (ref 80.0–100.0)
Monocytes Absolute: 0.4 10*3/uL (ref 0.1–1.0)
Monocytes Relative: 4 %
Neutro Abs: 9.2 10*3/uL — ABNORMAL HIGH (ref 1.7–7.7)
Neutrophils Relative %: 85 %
Platelets: 155 10*3/uL (ref 150–400)
RBC: 4.26 MIL/uL (ref 3.87–5.11)
RDW: 16.7 % — ABNORMAL HIGH (ref 11.5–15.5)
WBC: 10.9 10*3/uL — ABNORMAL HIGH (ref 4.0–10.5)
nRBC: 0 % (ref 0.0–0.2)

## 2024-01-15 LAB — BASIC METABOLIC PANEL WITH GFR
Anion gap: 8 (ref 5–15)
BUN: 8 mg/dL (ref 8–23)
CO2: 24 mmol/L (ref 22–32)
Calcium: 8.6 mg/dL — ABNORMAL LOW (ref 8.9–10.3)
Chloride: 105 mmol/L (ref 98–111)
Creatinine, Ser: 0.79 mg/dL (ref 0.44–1.00)
GFR, Estimated: >60 mL/min (ref >60)
Glucose, Bld: 180 mg/dL — ABNORMAL HIGH (ref 70–99)
Potassium: 4.5 mmol/L (ref 3.5–5.1)
Sodium: 137 mmol/L (ref 135–145)

## 2024-01-15 LAB — URINALYSIS, ROUTINE W REFLEX MICROSCOPIC
Bilirubin Urine: NEGATIVE
Glucose, UA: NEGATIVE mg/dL
Ketones, ur: 5 mg/dL — AB
Leukocytes,Ua: NEGATIVE
Nitrite: NEGATIVE
Protein, ur: NEGATIVE mg/dL
Specific Gravity, Urine: 1.013 (ref 1.005–1.030)
pH: 5 (ref 5.0–8.0)

## 2024-01-15 LAB — GLUCOSE, CAPILLARY
Glucose-Capillary: 186 mg/dL — ABNORMAL HIGH (ref 70–99)
Glucose-Capillary: 207 mg/dL — ABNORMAL HIGH (ref 70–99)
Glucose-Capillary: 174 mg/dL — ABNORMAL HIGH (ref 70–99)

## 2024-01-15 LAB — SURGICAL PCR SCREEN
MRSA, PCR: POSITIVE — AB
Staphylococcus aureus: POSITIVE — AB

## 2024-01-15 LAB — CBG MONITORING, ED: Glucose-Capillary: 176 mg/dL — ABNORMAL HIGH (ref 70–99)

## 2024-01-15 MED ORDER — ACETAMINOPHEN 500 MG PO TABS: 1000.0000 mg | ORAL_TABLET | Freq: Once | ORAL | Status: DC

## 2024-01-15 MED ORDER — CHLORHEXIDINE GLUCONATE CLOTH 2 % EX PADS
6.0000 | MEDICATED_PAD | Freq: Every day | CUTANEOUS | Status: AC
  Administered 2024-01-15 – 2024-01-19 (×5): 6 via TOPICAL

## 2024-01-15 MED ORDER — VALBENAZINE TOSYLATE 40 MG PO CAPS
40.0000 mg | ORAL_CAPSULE | Freq: Every evening | ORAL | Status: DC
  Administered 2024-01-15 – 2024-01-18 (×4): 40 mg via ORAL
  Filled 2024-01-15 (×5): qty 1

## 2024-01-15 MED ORDER — FERROUS SULFATE 325 (65 FE) MG PO TABS
325.0000 mg | ORAL_TABLET | Freq: Every day | ORAL | Status: DC
  Administered 2024-01-18 – 2024-01-19 (×2): 325 mg via ORAL
  Filled 2024-01-15 (×2): qty 1

## 2024-01-15 MED ORDER — POVIDONE-IODINE 10 % EX SWAB: 2.0000 | Freq: Once | CUTANEOUS | Status: DC

## 2024-01-15 MED ORDER — TRANEXAMIC ACID-NACL 1000-0.7 MG/100ML-% IV SOLN: 1000.0000 mg | INTRAVENOUS | Status: DC

## 2024-01-15 MED ORDER — SENNOSIDES-DOCUSATE SODIUM 8.6-50 MG PO TABS
1.0000 | ORAL_TABLET | Freq: Every day | ORAL | Status: DC
  Administered 2024-01-15 – 2024-01-18 (×4): 1 via ORAL
  Filled 2024-01-15 (×4): qty 1

## 2024-01-15 MED ORDER — LORAZEPAM 0.5 MG PO TABS
0.5000 mg | ORAL_TABLET | Freq: Three times a day (TID) | ORAL | Status: DC | PRN
  Administered 2024-01-15 – 2024-01-18 (×3): 0.5 mg via ORAL
  Filled 2024-01-15 (×3): qty 1

## 2024-01-15 MED ORDER — MUPIROCIN 2 % EX OINT
1.0000 | TOPICAL_OINTMENT | Freq: Two times a day (BID) | CUTANEOUS | Status: DC
  Administered 2024-01-15 – 2024-01-19 (×8): 1 via NASAL
  Filled 2024-01-15: qty 22

## 2024-01-15 MED ORDER — CEFAZOLIN SODIUM-DEXTROSE 2-4 GM/100ML-% IV SOLN: 2.0000 g | INTRAVENOUS | Status: DC

## 2024-01-15 MED ORDER — ACETAMINOPHEN 325 MG PO TABS
650.0000 mg | ORAL_TABLET | Freq: Four times a day (QID) | ORAL | Status: DC
  Administered 2024-01-15 – 2024-01-19 (×14): 650 mg via ORAL
  Filled 2024-01-15 (×14): qty 2

## 2024-01-15 MED ORDER — CHLORHEXIDINE GLUCONATE 4 % EX SOLN
60.0000 mL | Freq: Once | CUTANEOUS | Status: AC
  Administered 2024-01-16: 4 via TOPICAL
  Filled 2024-01-15: qty 60

## 2024-01-15 NOTE — ED Provider Notes (Signed)
 [facility] Provider Note   CSN: [id number] Arrival date & time: 01/15/24  0630     History {Add pertinent medical, surgical, social history, OB history to HPI:1} Chief Complaint  Patient presents with   Hip Pain    Kristin Mccarty is a 88 y.o. female.  87 year old female with a history of dementia and diabetes who presented to the emergency department after a fall.  Patient presents from [facility].  Had a fall yesterday.  Was witnessed.  No head strike or LOC.  Patient is nonverbal so cannot provide additional history.  Per EMS it was witnessed by staff.  Was complaining of right hip pain and had an x-ray this morning that shows a intertrochanteric right femur fracture. Per staff at [facility], at the desk standing and fell down onto her bottom.  DSS is her healthcare POA since she is a ward of the state. Uses a wheelchair but can sometime walk without assistance. Sometimes can answer yes or no questions.        Home Medications Prior to Admission medications   Medication Sig Start Date End Date Taking? Authorizing Provider  acetaminophen (TYLENOL) 325 MG tablet Take 650 mg by mouth every 4 (four) hours as needed for moderate pain.    [provider]  amLODipine (NORVASC) 10 MG tablet Take 10 mg by mouth daily.    [provider]  ammonium lactate (LAC-HYDRIN) 12 % lotion Apply 1 application topically See admin instructions. Apply to both feet at bedtime    [provider]  aspirin EC 81 MG tablet Take 81 mg by mouth daily. Swallow whole.    [provider]  atorvastatin (LIPITOR) 40 MG tablet Take 40 mg by mouth at bedtime.     [provider]  cephALEXin  (KEFLEX ) 500 MG capsule Take 1 capsule (500 mg total) by mouth 4 (four) times daily. 07/05/20   Auston Blush, MD  cholecalciferol (VITAMIN D3) 25 MCG (1000 UNIT) tablet Take 2,000 Units by mouth in the morning.      [provider]  Dulaglutide (TRULICITY) 1.5 MG/0.5ML SOPN Inject 1.5 mg into the skin every Wednesday.    [provider]  enalapril (VASOTEC) 10 MG tablet Take 10 mg by mouth 2 (two) times daily.    [provider]  ferrous sulfate 325 (65 FE) MG tablet Take 325 mg by mouth daily with breakfast.    [provider]  folic acid (FOLVITE) 1 MG tablet Take 1 mg by mouth daily.    [provider]  insulin  aspart (NOVOLOG ) 100 UNIT/ML injection Inject 0-10 Units into the skin See admin instructions. Inject 0-10 units into the skin three times a day before meals and at bedtime, per sliding scale: BGL 0-59 = CALL MD, 60-150 = give nothing, 151-199 = 2 units, 200-249 = 4 units, 250-299 = 6 units, 300-349 = 8 units, 350-399 = 10 units, 400-1,000 = CALL MD    [provider]  insulin  glargine (LANTUS SOLOSTAR) 100 UNIT/ML Solostar Pen Inject 15 Units into the skin at bedtime.    [provider]  loperamide (IMODIUM A-D) 2 MG tablet Take 2 mg by mouth 4 (four) times daily as needed for diarrhea or loose stools.    [provider]  Multiple Vitamins-Minerals (MULTIVITAMIN WITH MINERALS) tablet Take 1 tablet by mouth daily.    [provider]  sennosides-docusate sodium (SENOKOT-S) 8.6-50 MG tablet Take 1 tablet by mouth at  bedtime.    [provider]      Allergies    Patient has no known allergies.    Review of Systems   Review of Systems  Physical Exam Updated Vital Signs BP 110/66   Pulse 68   Temp 97.8 F (36.6 C)   Resp 18   SpO2 100%  Physical Exam Vitals and nursing note reviewed.  Constitutional:      General: She is not in acute distress.    Appearance: Normal appearance. She is well-developed. She is not ill-appearing.     Comments: nonverbal  HENT:     Head: Normocephalic and atraumatic.     Right Ear: External ear normal.     Left Ear: External ear normal.     Nose: Nose normal.      Mouth/Throat:     Mouth: Mucous membranes are moist.     Pharynx: Oropharynx is clear.  Eyes:     Extraocular Movements: Extraocular movements intact.     Conjunctiva/sclera: Conjunctivae normal.     Pupils: Pupils are equal, round, and reactive to light.  Neck:     Comments: No C-spine midline tenderness to palpation Cardiovascular:     Rate and Rhythm: Normal rate and regular rhythm.     Pulses: Normal pulses.     Heart sounds: Normal heart sounds. No murmur heard. Pulmonary:     Effort: Pulmonary effort is normal. No respiratory distress.     Breath sounds: Normal breath sounds.  Abdominal:     General: Abdomen is flat. There is no distension.     Palpations: Abdomen is soft. There is no mass.     Tenderness: There is no abdominal tenderness. There is no guarding.  Musculoskeletal:        General: No deformity. Normal range of motion.     Cervical back: Normal range of motion and neck supple. No rigidity or tenderness.     Right lower leg: No edema.     Left lower leg: No edema.     Comments: No tenderness to palpation of midline thoracic or lumbar spine.  No step-offs palpated.  No tenderness to palpation of chest wall.  No bruising noted.  No tenderness to palpation of bilateral clavicles.  No tenderness to palpation, bruising, or deformities noted of bilateral shoulders, elbows, wrists, knees, or ankles. Winces when palpating R hip. Monophasic doppler able pulses in both feet.  Both feet appear warm and well-perfused.  Skin:    General: Skin is warm and dry.  Neurological:     General: No focal deficit present.     Mental Status: She is alert. Mental status is at baseline.     Cranial Nerves: No cranial nerve deficit.     Sensory: No sensory deficit.     Motor: No weakness.  Psychiatric:        Mood and Affect: Mood normal.     ED Results / Procedures / Treatments   Labs (all labs ordered are listed, but only abnormal results are displayed) Labs Reviewed  CBC WITH  DIFFERENTIAL/PLATELET  BASIC METABOLIC PANEL WITH GFR    EKG None  Radiology No results found.  Procedures Procedures  {Document cardiac monitor, telemetry assessment procedure when appropriate:1}  Medications Ordered in ED Medications - No data to display  ED Course/ Medical Decision Making/ A&P Clinical Course as of 01/15/24 0733  Sat Jan 15, 2024  0732 Dnr at the bedside [RP]    Clinical Course User Index [RP]  Ninetta Basket, MD   {   Click here for ABCD2, HEART and other calculatorsREFRESH Note before signing :1}                              Medical Decision Making  ***  {Document critical care time when appropriate:1} {Document review of labs and clinical decision tools ie heart score, Chads2Vasc2 etc:1}  {Document your independent review of radiology images, and any outside records:1} {Document your discussion with family members, caretakers, and with consultants:1} {Document social determinants of health affecting pt's care:1} {Document your decision making why or why not admission, treatments were needed:1} Final Clinical Impression(s) / ED Diagnoses Final diagnoses:  None    Rx / DC Orders ED Discharge Orders     None

## 2024-01-15 NOTE — ED Notes (Signed)
PIV attempt x 2, unsuccessful.

## 2024-01-15 NOTE — Plan of Care (Signed)
   Problem: Education: Goal: Knowledge of General Education information will improve Description Including pain rating scale, medication(s)/side effects and non-pharmacologic comfort measures Outcome: Progressing

## 2024-01-15 NOTE — ED Triage Notes (Signed)
 Pt is coming from [facility], she had a fall yesterday, they did a hip X-ray at 4am this morning which indicated a Fractured right femur. There is obvious shortening and rotation of the right leg. She has some weak pulses on the right compared to the left. She is demented at baseline as well. No other noted injuries. The fall that did occur was witnessed by staff when it occurred, there was no LOC, no head injury.   Medic vitals   118/72 60hr 264bgl 18rr 98%ra

## 2024-01-15 NOTE — Hospital Course (Addendum)
 Right femur intertrochanteric fracture Osteoporosis Patient presented from her assisted living facility with R hip pain after a fall from standing. Hip xray in the ED showed acute comminuted fracture deformity through the intertrochanteric portions of the proximal right femur. Orthopedic surgery team was consulted, who performed cephalomedullary nailing of the right femur on 4/28. On post-op day 2 patient's pain was well-controlled, and she was deemed medically stable to be discharged to her ALF. Patient was discharged with a 5 day course of tramadol 50mg  q6h prn, and ASA 81mg  BID for 30 days for DVT ppx  Anxiety: continued home lorazepam 0.5mg  q8h prn Tardive dyskinesia: home valbenazine 40mg  daily Iron deficiency anemia: home ferrous sulfate 325mg  Diabetes mellitus: diet controlled, blood sugar at goal

## 2024-01-15 NOTE — ED Notes (Signed)
 IV team at bedside

## 2024-01-15 NOTE — Progress Notes (Signed)
 [facility] ED rm 15 [facility] Liaison Note?    Ms. Kristin Mccarty is a current [facility] patient with a terminal diagnosis of Alzheimer's Dementia who resides at [facility]. She suffered a fall on 4.25 and [facility] was notified and a visit made. X-ray was ordered and resulted in dx of right femur fracture and decision was made for patient to be transported to the hospital. She has been admitted on 4.26 with diagnosis right femur fracture with plan for surgical fixation likely tomorrow. Per Dr. Tessie Fila with [facility] this is a related hospital admission.   Visited in the ED with patient, she is resting quietly without signs of pain or distress. She is awaiting assessment by orthopedic MD and will likely undergo fixation of femur fracture tomorrow. Was unable to reach legal guardian today.   Patient is inpatient appropriate with need for surgical fixation of right femur fracture along with pain control.  Vital Signs: 97.9/76/18    161/94     spO2 100% on room air I/O:  240/50 Abnormal labs: Ca+ 8.6, WBC 10.9, Hgb 11, Hct 35.9 Diagnostics:  DG HIP (WITH OR WITHOUT PELVIS) 2-3V RIGHT IMPRESSION: Acute, comminuted fracture deformity through the intertrochanteric portions of the proximal right femur.  Electronically Signed   By: Kimberley Penman M.D.   On: 01/15/2024 07:57   IV/PRN Meds: N/A  Problem list as per H&P Dr. Jayson Michael 4.26 R femur intertrochanteric fracture Osteoporosis Comminuted intertrochanteric fracture seen on XR. Patient is resting comfortably in bed, no significant tenderness with palpation. Orthopedic surgery consulted. With her history of osteopenia and new fracture after ground level fall, she now carries a diagnosis of osteoporosis.  - Orthopedic surgery consulted, appreciate assistance - NPO at midnight for possible surgery tomorrow - Scheduled Tylenol 650mg  q6h   Anxiety Continue home lorazepam 0.5mg  q8h prn   Tardive  dyskinesia Continue home valbenazine 40mg  daily   Iron deficiency anemia Hgb stable at 11.0. Continue home ferrous sulfate 325mg     Diabetes mellitus Not on any home medications. Blood sugar 180 on admission - q4h CBG checks, will not start SSI at this time.    Diet: Dysphagia 3, NPO at midnight  Discharge Planning:? Ongoing Family contact: None IDT:? Updated?   Goals of Care: Ongoing, DNR  If patient requires ambulance transport at discharge please use [facility] as we contract this service for our patients.  Transfer summary/Med list have been submitted to medical records Please call with any hospice related questions or concerns. Dwane Gitelman, BSN RN Hospice hospital liaison [phone]

## 2024-01-15 NOTE — Consult Note (Signed)
 ORTHOPAEDIC CONSULTATION  REQUESTING PHYSICIAN: Cherylene Corrente, MD  PCP:  Tita Form, MD  Chief Complaint: fall, right hip fx  HPI: Kristin Mccarty is a 88 y.o. female history of advanced dementia, osteopenia, T2DM, who was brought to the ED from her assisted living facility after a fall. Per ED report, patient had a witnessed fall while rising from sitting to standing yesterday. She did not hit her head or lose consciousness. She was having right hip pain this am and was brought to the ED. Per her ALF nurse, patient has frequent falls and generally is confined to a wheelchair. Patient is a ward of the state and has been living at [facility] since 2011. She is on hospice. X-rays show acute comminuted intertrochanteric fracture right femur.   Patient is non verbal, history from chart. I reviewed her x-rays.      Past Medical History:  Diagnosis Date   Diabetes mellitus without complication (HCC)    History reviewed. No pertinent surgical history. Social History   Socioeconomic History   Marital status: Married    Spouse name: Not on file   Number of children: Not on file   Years of education: Not on file   Highest education level: Not on file  Occupational History   Not on file  Tobacco Use   Smoking status: Never   Smokeless tobacco: Never  Substance and Sexual Activity   Alcohol use: Not Currently   Drug use: Not Currently   Sexual activity: Not on file  Other Topics Concern   Not on file  Social History Narrative   Not on file   Social Drivers of Health   Financial Resource Strain: Not on file  Food Insecurity: Patient Unable To Answer (01/15/2024)   Hunger Vital Sign    Worried About Running Out of Food in the Last Year: Patient unable to answer    Ran Out of Food in the Last Year: Patient unable to answer  Transportation Needs: Patient Unable To Answer (01/15/2024)   PRAPARE - Transportation    Lack of Transportation (Medical): Patient unable  to answer    Lack of Transportation (Non-Medical): Patient unable to answer  Physical Activity: Not on file  Stress: Not on file  Social Connections: Patient Unable To Answer (01/15/2024)   Social Connection and Isolation Panel [NHANES]    Frequency of Communication with Friends and Family: Patient unable to answer    Frequency of Social Gatherings with Friends and Family: Patient unable to answer    Attends Religious Services: Patient unable to answer    Active Member of Clubs or Organizations: Patient unable to answer    Attends Banker Meetings: Patient unable to answer    Marital Status: Patient unable to answer   History reviewed. No pertinent family history. No Known Allergies Prior to Admission medications   Medication Sig Start Date End Date Taking? Authorizing Provider  ferrous sulfate 325 (65 FE) MG tablet Take 325 mg by mouth daily with breakfast.   Yes [provider]  INGREZZA 40 MG capsule Take 40 mg by mouth every evening.   Yes [provider]  LORazepam (ATIVAN) 0.5 MG tablet Take 0.5 mg by mouth every 8 (eight) hours as needed for anxiety (or agitation).   Yes [provider]  NUTRITIONAL SUPPLEMENTS PO Take 90 mLs by mouth See admin instructions. MedPass: Drink 90 ml's by mouth 3 times a day after meals   Yes [provider]  sennosides-docusate sodium (SENOKOT-S) 8.6-50 MG tablet Take 1 tablet by mouth at bedtime.   Yes [provider]  cephALEXin  (KEFLEX ) 500 MG capsule Take 1 capsule (500 mg total) by mouth 4 (four) times daily. Patient not taking: Reported on 01/15/2024 07/05/20   Auston Blush, MD   DG Knee Right Port Result Date: 01/15/2024 CLINICAL DATA:  [id number] Closed comminuted intertrochanteric fracture of proximal end of right femur [facility]) [id number] EXAM: PORTABLE RIGHT KNEE - 1-2 VIEW COMPARISON:  None Available. FINDINGS: Osteopenia. No fracture or dislocation. No definite effusion. No true lateral  radiograph is submitted however, limiting sensitivity. Scattered arterial calcifications. IMPRESSION: 1. Limited, no acute findings. 2. Osteopenia. Electronically Signed   By: Nicoletta Barrier M.D.   On: 01/15/2024 09:50   DG Chest 1 View Result Date: 01/15/2024 CLINICAL DATA:  Fall. EXAM: CHEST  1 VIEW COMPARISON:  07/05/2020 FINDINGS: Heart size and mediastinal contours are unremarkable. Low lung volumes with slight asymmetric elevation of the right hemidiaphragm. No pleural fluid, interstitial edema, or airspace disease. The visualized osseous structures appear intact. IMPRESSION: Low lung volumes. No acute findings. Electronically Signed   By: Kimberley Penman M.D.   On: 01/15/2024 07:58   DG HIP UNILAT W OR W/O PELVIS 2-3 VIEWS RIGHT Result Date: 01/15/2024 CLINICAL DATA:  Status post fall. EXAM: DG HIP (WITH OR WITHOUT PELVIS) 2-3V RIGHT COMPARISON:  None Available. FINDINGS: There is an acute, comminuted fracture deformity through the intertrochanteric portions of the proximal right femur. Proximal displacement of the distal fracture fragments with medial angulation. Left hip is located and intact. Diffuse osteopenia. IMPRESSION: Acute, comminuted fracture deformity through the intertrochanteric portions of the proximal right femur. Electronically Signed   By: Kimberley Penman M.D.   On: 01/15/2024 07:57    Positive ROS: All other systems have been reviewed and were otherwise negative with the exception of those mentioned in the HPI and as above.  Physical Exam: General: Pleasantly demented. Non-verbal, alert, comfortable. She did follow some commands. Does nod some yes and no to questions.  Cardiovascular: No pedal edema Respiratory: No cyanosis, no use of accessory musculature GI: No organomegaly, abdomen is soft and non-tender Skin: No lesions in the area of chief complaint Neurologic: She does respond to light touch unable to fully assess sensation.   MUSCULOSKELETAL:  Examination of the right  hip reveals no skin wounds or lesions. LE shortened and externally rotated. Pain with palpation over trochanteric region. No ROM performed due to known fracture.  Unable to fully assess motor function and sensation due to mental status. Responsive to light touch in LE bilaterally. She does move ankles and toes on command in LE bilaterally. Distal pedal pulses 2+ bilaterally. Calves soft, appear to be non-tender. No significant edema in LE bilaterally.  Capillar refill <2 seconds.   Assessment: Acute right intertrochanteric fracture.  Dementia, ward of the state.  T2DM  Plan: I reviewed her x-rays. She has an acute right intertrochanteric fracture that will require surgical  fixation for pain control and allow mobilization out of bed. [facility] has already admitted patient, appreciate their consult. Patient is a ward of the state, consent will be required for surgery. Plan for IM fixation tomorrow pending consent. NPO after midnight. Hold chemical DVT ppx for planned surgery tomorrow.     Harman Lightning, PA-C    01/15/2024 4:26 PM

## 2024-01-15 NOTE — Progress Notes (Signed)
 Attempted to call x3 Danney Dutton (legal guardian) for informed consent for tomorrow's scheduled surgery. Mail box is full and won't accept any messages, charge nurse made aware.

## 2024-01-15 NOTE — H&P (Signed)
 Date: 01/15/2024               Patient Name:  Kristin Mccarty MRN: [id number]  DOB: 1935-09-23 Age / Sex: 88 y.o., female   PCP: Tita Form, MD         Medical Service: Internal Medicine Teaching Service         Attending Physician: Dr. Ninetta Basket, MD      First Contact: Dr. Jayson Michael, MD Pager [phone]    Second Contact: Dr Lorelle Roll, DO         After Hours (After 5p/  First Contact Pager: [phone]  weekends / holidays): Second Contact Pager: [phone]   SUBJECTIVE   Chief Complaint: fall  History of Present Illness:  Patient is an 88 yo female with history of advanced dementia, osteopenia, T2DM, who was brought to the ED from her assisted living facility after a fall. Per ED report, patient had a witnessed fall while rising from sitting to standing yesterday. She did not hit her head or lose consciousness. She was having right hip pain this am and was brought to the ED. Per her ALF nurse, patient has frequent falls and generally is confined to a wheelchair.   Patient is a ward of the state and has been living at [facility] since 2011. She is on hospice.   ED Course: Afeb, VSS, ORA No leukocytosis, Hgb 11 CXR: no acute findings XR hip: Acute, comminuted fracture deformity through the intertrochanteric portions of the proximal right femur XR knee: no acute findings. Osteopenia  Past Medical History: Past Medical History:  Diagnosis Date   Diabetes mellitus without complication (HCC)     Meds:  Current Outpatient Medications  Medication Instructions   cephALEXin  (KEFLEX ) 500 mg, Oral, 4 times daily   ferrous sulfate 325 mg, Daily with breakfast   Ingrezza 40 mg, Every evening   LORazepam (ATIVAN) 0.5 mg, Oral, Every 8 hours PRN   NUTRITIONAL SUPPLEMENTS PO 90 mLs, Oral, See admin instructions, MedPass: Drink 90 ml's by mouth 3 times a day after meals   sennosides-docusate sodium (SENOKOT-S) 8.6-50 MG tablet 1 tablet, Daily at bedtime    History  reviewed. No pertinent surgical history.  Social:  Living Situation: [facility], resident since 2011. Requires assistance for ADLs. Usually uses a wheelchair, can stand/walk short distances PCP: Tita Form, MD Tobacco:  Alcohol: Drugs:  Family History: noncontributory  Allergies: Allergies as of 01/15/2024   (No Known Allergies)   Review of Systems: A complete ROS was negative except as per HPI.   OBJECTIVE:   Physical Exam: Blood pressure (!) 143/77, pulse 68, temperature (!) 97.4 F (36.3 C), temperature source Oral, resp. rate 14, SpO2 100%.  Constitutional: pleasantly demented. Minimally verbal, can say name and yes/no. Comfortable appearing Cardiovascular: RRR Pulmonary/Chest: no respiratory distress Abdominal: soft, nontender. Normoactive bowel sounds MSK: Externally rotated right leg. Nontender to palpation of right hip Neurological: A&O x 1. Follows commands. Tremulous hands while reaching  Labs: CBC    Component Value Date/Time   WBC 10.9 (H) 01/15/2024 0830   RBC 4.26 01/15/2024 0830   HGB 11.0 (L) 01/15/2024 0830   HCT 35.9 (L) 01/15/2024 0830   PLT 155 01/15/2024 0830   MCV 84.3 01/15/2024 0830   MCH 25.8 (L) 01/15/2024 0830   MCHC 30.6 01/15/2024 0830   RDW 16.7 (H) 01/15/2024 0830   LYMPHSABS 1.3 01/15/2024 0830   MONOABS 0.4 01/15/2024 0830   EOSABS 0.0 01/15/2024 0830  BASOSABS 0.0 01/15/2024 0830     CMP     Component Value Date/Time   NA 137 01/15/2024 0830   K 4.5 01/15/2024 0830   CL 105 01/15/2024 0830   CO2 24 01/15/2024 0830   GLUCOSE 180 (H) 01/15/2024 0830   BUN 8 01/15/2024 0830   CREATININE 0.79 01/15/2024 0830   CALCIUM 8.6 (L) 01/15/2024 0830   PROT 5.8 (L) 07/05/2020 1953   ALBUMIN 2.3 (L) 07/05/2020 1953   AST 31 07/05/2020 1953   ALT 27 07/05/2020 1953   ALKPHOS 78 07/05/2020 1953   BILITOT 1.3 (H) 07/05/2020 1953   GFRNONAA >60 01/15/2024 0830   GFRAA 38 (L) 06/24/2020 0119    Imaging: DG Knee  Right Port Result Date: 01/15/2024 CLINICAL DATA:  [id number] Closed comminuted intertrochanteric fracture of proximal end of right femur (HCC) [id number] EXAM: PORTABLE RIGHT KNEE - 1-2 VIEW COMPARISON:  None Available. FINDINGS: Osteopenia. No fracture or dislocation. No definite effusion. No true lateral radiograph is submitted however, limiting sensitivity. Scattered arterial calcifications. IMPRESSION: 1. Limited, no acute findings. 2. Osteopenia. Electronically Signed   By: Nicoletta Barrier M.D.   On: 01/15/2024 09:50   DG Chest 1 View Result Date: 01/15/2024 CLINICAL DATA:  Fall. EXAM: CHEST  1 VIEW COMPARISON:  07/05/2020 FINDINGS: Heart size and mediastinal contours are unremarkable. Low lung volumes with slight asymmetric elevation of the right hemidiaphragm. No pleural fluid, interstitial edema, or airspace disease. The visualized osseous structures appear intact. IMPRESSION: Low lung volumes. No acute findings. Electronically Signed   By: Kimberley Penman M.D.   On: 01/15/2024 07:58   DG HIP UNILAT W OR W/O PELVIS 2-3 VIEWS RIGHT Result Date: 01/15/2024 CLINICAL DATA:  Status post fall. EXAM: DG HIP (WITH OR WITHOUT PELVIS) 2-3V RIGHT COMPARISON:  None Available. FINDINGS: There is an acute, comminuted fracture deformity through the intertrochanteric portions of the proximal right femur. Proximal displacement of the distal fracture fragments with medial angulation. Left hip is located and intact. Diffuse osteopenia. IMPRESSION: Acute, comminuted fracture deformity through the intertrochanteric portions of the proximal right femur. Electronically Signed   By: Kimberley Penman M.D.   On: 01/15/2024 07:57    ASSESSMENT & PLAN:   Kristin Mccarty is a 88 y.o. person living with a history of advanced dementia, osteopenia who presented with right hip pain and admitted for right femur fracture on hospital day 0  R femur intertrochanteric fracture Osteoporosis Comminuted intertrochanteric fracture seen on XR.  Patient is resting comfortably in bed, no significant tenderness with palpation. Orthopedic surgery consulted. With her history of osteopenia and new fracture after ground level fall, she now carries a diagnosis of osteoporosis.  - Orthopedic surgery consulted, appreciate assistance - NPO at midnight for possible surgery tomorrow - Scheduled Tylenol 650mg  q6h  Anxiety Continue home lorazepam 0.5mg  q8h prn  Tardive dyskinesia Continue home valbenazine 40mg  daily  Iron deficiency anemia Hgb stable at 11.0. Continue home ferrous sulfate 325mg    Diabetes mellitus Not on any home medications. Blood sugar 180 on admission - q4h CBG checks, will not start SSI at this time.   Diet: Dysphagia 3, NPO at midnight VTE: SCD Code: DNR/DNI  Prior to Admission Living Arrangement: [facility] Care Anticipated Discharge Location: pending PT/OT eval Barriers to Discharge: pending medical stability  Signed: Jayson Michael, MD Internal Medicine Resident PGY-1  01/15/2024, 10:42 AM

## 2024-01-16 ENCOUNTER — Inpatient Hospital Stay (HOSPITAL_COMMUNITY): Admitting: Anesthesiology

## 2024-01-16 ENCOUNTER — Encounter (HOSPITAL_COMMUNITY)
Admission: EM | Disposition: A | Discharge status: Skilled Nursing Facility | Payer: Self-pay | Source: Skilled Nursing Facility | Attending: Internal Medicine

## 2024-01-16 DIAGNOSIS — W19XXXA Unspecified fall, initial encounter: Secondary | ICD-10-CM

## 2024-01-16 DIAGNOSIS — Z8659 Personal history of other mental and behavioral disorders: Secondary | ICD-10-CM

## 2024-01-16 DIAGNOSIS — S72141A Displaced intertrochanteric fracture of right femur, initial encounter for closed fracture: Secondary | ICD-10-CM | POA: Diagnosis not present

## 2024-01-16 LAB — GLUCOSE, CAPILLARY
Glucose-Capillary: 106 mg/dL — ABNORMAL HIGH (ref 70–99)
Glucose-Capillary: 106 mg/dL — ABNORMAL HIGH (ref 70–99)
Glucose-Capillary: 110 mg/dL — ABNORMAL HIGH (ref 70–99)
Glucose-Capillary: 116 mg/dL — ABNORMAL HIGH (ref 70–99)
Glucose-Capillary: 121 mg/dL — ABNORMAL HIGH (ref 70–99)
Glucose-Capillary: 128 mg/dL — ABNORMAL HIGH (ref 70–99)
Glucose-Capillary: 137 mg/dL — ABNORMAL HIGH (ref 70–99)

## 2024-01-16 LAB — CBC
HCT: 36.1 % (ref 36.0–46.0)
Hemoglobin: 11.6 g/dL — ABNORMAL LOW (ref 12.0–15.0)
MCH: 26.3 pg (ref 26.0–34.0)
MCHC: 32.1 g/dL (ref 30.0–36.0)
MCV: 81.9 fL (ref 80.0–100.0)
Platelets: DECREASED 10*3/uL (ref 150–400)
RBC: 4.41 MIL/uL (ref 3.87–5.11)
RDW: 16.4 % — ABNORMAL HIGH (ref 11.5–15.5)
WBC: 12.6 10*3/uL — ABNORMAL HIGH (ref 4.0–10.5)
nRBC: 0 % (ref 0.0–0.2)

## 2024-01-16 LAB — BASIC METABOLIC PANEL WITH GFR
Anion gap: 10 (ref 5–15)
BUN: 9 mg/dL (ref 8–23)
CO2: 24 mmol/L (ref 22–32)
Calcium: 8.8 mg/dL — ABNORMAL LOW (ref 8.9–10.3)
Chloride: 101 mmol/L (ref 98–111)
Creatinine, Ser: 0.63 mg/dL (ref 0.44–1.00)
GFR, Estimated: >60 mL/min (ref >60)
Glucose, Bld: 120 mg/dL — ABNORMAL HIGH (ref 70–99)
Potassium: 4.6 mmol/L (ref 3.5–5.1)
Sodium: 135 mmol/L (ref 135–145)

## 2024-01-16 SURGERY — FIXATION, FRACTURE, INTERTROCHANTERIC, WITH INTRAMEDULLARY ROD
Anesthesia: Choice | Laterality: Right

## 2024-01-16 MED ORDER — BUPIVACAINE HCL (PF) 0.5 % IJ SOLN
INTRAMUSCULAR | Status: AC
Start: 2024-01-16 — End: ?
  Filled 2024-01-16: qty 10

## 2024-01-16 MED ORDER — MIDAZOLAM HCL 2 MG/2ML IJ SOLN
INTRAMUSCULAR | Status: AC
  Filled 2024-01-16: qty 2

## 2024-01-16 MED ORDER — PROPOFOL 10 MG/ML IV BOLUS
INTRAVENOUS | Status: AC
  Filled 2024-01-16: qty 20

## 2024-01-16 MED ORDER — FENTANYL CITRATE (PF) 250 MCG/5ML IJ SOLN
INTRAMUSCULAR | Status: AC
  Filled 2024-01-16: qty 5

## 2024-01-16 MED ORDER — KETAMINE HCL 50 MG/5ML IJ SOSY
PREFILLED_SYRINGE | INTRAMUSCULAR | Status: AC
  Filled 2024-01-16: qty 5

## 2024-01-16 NOTE — Plan of Care (Signed)
  Problem: Health Behavior/Discharge Planning: Goal: Ability to manage health-related needs will improve Outcome: Progressing   Problem: Clinical Measurements: Goal: Ability to maintain clinical measurements within normal limits will improve Outcome: Progressing Goal: Diagnostic test results will improve Outcome: Progressing   Problem: Coping: Goal: Level of anxiety will decrease Outcome: Progressing   

## 2024-01-16 NOTE — Progress Notes (Addendum)
 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 She suffered a fall on 4.25 and [facility] was notified and a visit made. X-ray was ordered and resulted in dx of right femur fracture and decision was made for patient to be transported to the hospital. She has been admitted on 4.26 with diagnosis right femur fracture with plan for surgical fixation likely tomorrow. Per Dr. Tessie Fila with [facility] this is a related hospital admission.   Visited patient at bedside. She is pleasant and answers questions with one word answers, not always correctly. She appears to be comfortable at this time.  Patient remains inpatient appropriate awaiting surgical fixation of right femur fracture.  V/S: 98.2, 138/74, 63, 100 % on room air  Abnormal Labs: 01/16/24 04:44 Glucose: 120 (H) WBC: 12.6 (H) Hemoglobin: 11.6 (L) RDW: 16.4 (H) Platelets: PLATELET CLUMPS NOTED ON SMEAR, COUNT APPEARS DECREASED  Diagnostics: None new  IV/PRN Meds: Ativan 0.5 mg po X 1  Assessment/Plan per Dr Jayson Michael note 4.27.2025  R femur intertrochanteric fracture Osteoporosis Orthopedic surgery has been consulted and plans for surgical fixation today, though consent from patient's guardian has not yet been received. She has been NPO since midnight - OR today for surgical fixation pending consent from guardian - Scheduled Tylenol 650mg  q6h   Anxiety: continue home lorazepam 0.5mg  q8h prn Tardive dyskinesia: home valbenazine 40mg  daily Iron deficiency anemia: home ferrous sulfate 325mg  Diabetes mellitus: diet controlled, blood sugar at goal. Q4h CBG checks   Discharge Planning:? Ongoing Family contact: Spoke with guardian Danney Dutton via phone number [phone]. Left voicemail for her on this number and she called me back from [phone] stating she had cleared her voice  mailbox. Informed her hospital staff were trying to reach her for surgical consent and that I would notify staff I had spoken with her and that she could be reached at [phone]. Secure Epic chat sent to Internal Medicine Residents Nooruddin, Ghiles; Attending Winfrey, bedside RN and TOC. IDT:? Updated?   Goals of Care: Ongoing, DNR   If patient requires ambulance transport at discharge please use [facility] as we contract this service for our patients.   Please call with any hospice related questions or concerns.  Thank you, Lestine Rathke, BSN, [facility] Liaison [phone]

## 2024-01-16 NOTE — Progress Notes (Signed)
                  Subjective:   Summary: 88 yo F with advanced dementia brought to the ED from her long term care facility after a ground level fall, found to have communited intertrochanteric R femur fracture.   Patient continues to be pleasantly demented and minimally verbal, does not appear to be in any distress  Objective:  Vital signs in last 24 hours: Vitals:   01/15/24 1125 01/15/24 1616 01/15/24 1937 01/16/24 0415  BP: (!) 161/94 (!) 152/76 (!) 147/82 126/82  Pulse: 76 82 70 66  Resp: 18 18 18 18   Temp: 97.9 F (36.6 C) 97.9 F (36.6 C)    TempSrc: Axillary Oral    SpO2: 100% 100% 100% 100%   Supplemental O2: Room Air SpO2: 100 %  Physical Exam:  Constitutional: pleasantly demented, minimally verbal. Mostly responds with "yes" Cardiovascular: RRR Ext: externally rotated R leg, hip nontender to palpation  Assessment/Plan:   R femur intertrochanteric fracture Osteoporosis Orthopedic surgery has been consulted and plans for surgical fixation today, though consent from patient's guardian has not yet been received. She has been NPO since midnight - OR today for surgical fixation pending consent from guardian - Scheduled Tylenol 650mg  q6h  Anxiety: continue home lorazepam 0.5mg  q8h prn Tardive dyskinesia: home valbenazine 40mg  daily Iron deficiency anemia: home ferrous sulfate 325mg  Diabetes mellitus: diet controlled, blood sugar at goal. Q4h CBG checks  Diet: NPO VTE: SCD Code: DNR/DNI on hospice  Dispo: Anticipated discharge to Skilled nursing facility pending medical stability.   Jayson Michael, MD PGY-1 Internal Medicine Resident Pager Number [phone] Please contact the on call pager after 5 pm and on weekends at [phone].

## 2024-01-16 NOTE — Progress Notes (Signed)
 Another unsuccessful attempt made to patient's guardian for consent. Voicemail box is full.

## 2024-01-16 NOTE — Progress Notes (Signed)
 Unable to reach legal guardian for consent.

## 2024-01-16 NOTE — Plan of Care (Signed)
°  Problem: Safety: Goal: Ability to remain free from injury will improve Outcome: Progressing   Problem: Skin Integrity: Goal: Risk for impaired skin integrity will decrease Outcome: Progressing   Problem: Skin Integrity: Goal: Risk for impaired skin integrity will decrease Outcome: Progressing

## 2024-01-16 NOTE — Progress Notes (Signed)
 Surgery will have to be rescheduled due to inability to reach guardian. Discussed patient with OTS, who plan for surgery this week.  May have diet. NPO after MN.

## 2024-01-17 ENCOUNTER — Inpatient Hospital Stay (HOSPITAL_COMMUNITY): Admitting: Certified Registered Nurse Anesthetist

## 2024-01-17 ENCOUNTER — Encounter (HOSPITAL_COMMUNITY)
Admission: EM | Disposition: A | Discharge status: Skilled Nursing Facility | Payer: Self-pay | Source: Skilled Nursing Facility | Attending: Internal Medicine

## 2024-01-17 ENCOUNTER — Inpatient Hospital Stay (HOSPITAL_COMMUNITY)

## 2024-01-17 ENCOUNTER — Encounter (HOSPITAL_COMMUNITY): Payer: Self-pay | Admitting: Internal Medicine

## 2024-01-17 DIAGNOSIS — S72141A Displaced intertrochanteric fracture of right femur, initial encounter for closed fracture: Secondary | ICD-10-CM

## 2024-01-17 DIAGNOSIS — E785 Hyperlipidemia, unspecified: Secondary | ICD-10-CM | POA: Diagnosis not present

## 2024-01-17 DIAGNOSIS — Z8659 Personal history of other mental and behavioral disorders: Secondary | ICD-10-CM | POA: Diagnosis not present

## 2024-01-17 DIAGNOSIS — I509 Heart failure, unspecified: Secondary | ICD-10-CM | POA: Diagnosis not present

## 2024-01-17 DIAGNOSIS — I110 Hypertensive heart disease with heart failure: Secondary | ICD-10-CM | POA: Diagnosis not present

## 2024-01-17 DIAGNOSIS — W19XXXA Unspecified fall, initial encounter: Secondary | ICD-10-CM | POA: Diagnosis not present

## 2024-01-17 HISTORY — PX: INTRAMEDULLARY (IM) NAIL INTERTROCHANTERIC: SHX5875

## 2024-01-17 LAB — GLUCOSE, CAPILLARY
Glucose-Capillary: 87 mg/dL (ref 70–99)
Glucose-Capillary: 131 mg/dL — ABNORMAL HIGH (ref 70–99)
Glucose-Capillary: 145 mg/dL — ABNORMAL HIGH (ref 70–99)
Glucose-Capillary: 167 mg/dL — ABNORMAL HIGH (ref 70–99)

## 2024-01-17 LAB — CBC
HCT: 34.6 % — ABNORMAL LOW (ref 36.0–46.0)
Hemoglobin: 11.1 g/dL — ABNORMAL LOW (ref 12.0–15.0)
MCH: 26.4 pg (ref 26.0–34.0)
MCHC: 32.1 g/dL (ref 30.0–36.0)
MCV: 82.2 fL (ref 80.0–100.0)
Platelets: 118 10*3/uL — ABNORMAL LOW (ref 150–400)
RBC: 4.21 MIL/uL (ref 3.87–5.11)
RDW: 16.6 % — ABNORMAL HIGH (ref 11.5–15.5)
WBC: 10.8 10*3/uL — ABNORMAL HIGH (ref 4.0–10.5)
nRBC: 0 % (ref 0.0–0.2)

## 2024-01-17 LAB — VITAMIN D 25 HYDROXY (VIT D DEFICIENCY, FRACTURES): Vit D, 25-Hydroxy: 48.19 ng/mL (ref 30–100)

## 2024-01-17 SURGERY — FIXATION, FRACTURE, INTERTROCHANTERIC, WITH INTRAMEDULLARY ROD
Anesthesia: General | Laterality: Right

## 2024-01-17 MED ORDER — ROCURONIUM BROMIDE 10 MG/ML (PF) SYRINGE
PREFILLED_SYRINGE | INTRAVENOUS | Status: AC
  Filled 2024-01-17: qty 10

## 2024-01-17 MED ORDER — TRAMADOL HCL 50 MG PO TABS
50.0000 mg | ORAL_TABLET | Freq: Four times a day (QID) | ORAL | Status: DC | PRN
  Administered 2024-01-17 – 2024-01-18 (×2): 100 mg via ORAL
  Administered 2024-01-18: 50 mg via ORAL
  Administered 2024-01-18: 100 mg via ORAL
  Filled 2024-01-17: qty 2
  Filled 2024-01-17: qty 1
  Filled 2024-01-17 (×2): qty 2

## 2024-01-17 MED ORDER — TRANEXAMIC ACID-NACL 1000-0.7 MG/100ML-% IV SOLN
1000.0000 mg | Freq: Once | INTRAVENOUS | Status: AC
  Administered 2024-01-17: 1000 mg via INTRAVENOUS
  Filled 2024-01-17: qty 100

## 2024-01-17 MED ORDER — ONDANSETRON HCL 4 MG/2ML IJ SOLN
INTRAMUSCULAR | Status: AC
  Filled 2024-01-17: qty 2

## 2024-01-17 MED ORDER — VANCOMYCIN HCL 1000 MG IV SOLR
INTRAVENOUS | Status: AC
Start: 2024-01-17 — End: ?
  Filled 2024-01-17: qty 20

## 2024-01-17 MED ORDER — ROCURONIUM BROMIDE 10 MG/ML (PF) SYRINGE
PREFILLED_SYRINGE | INTRAVENOUS | Status: DC | PRN
  Administered 2024-01-17: 40 mg via INTRAVENOUS

## 2024-01-17 MED ORDER — MUPIROCIN 2 % EX OINT: 1.0000 | TOPICAL_OINTMENT | Freq: Two times a day (BID) | CUTANEOUS | 0 refills | Status: AC

## 2024-01-17 MED ORDER — ASPIRIN 81 MG PO TBEC
81.0000 mg | DELAYED_RELEASE_TABLET | Freq: Every day | ORAL | Status: DC
  Administered 2024-01-18: 81 mg via ORAL
  Filled 2024-01-17: qty 1

## 2024-01-17 MED ORDER — PHENYLEPHRINE HCL-NACL 20-0.9 MG/250ML-% IV SOLN
INTRAVENOUS | Status: DC | PRN
  Administered 2024-01-17: 50 ug/min via INTRAVENOUS

## 2024-01-17 MED ORDER — CEFAZOLIN SODIUM-DEXTROSE 2-4 GM/100ML-% IV SOLN
INTRAVENOUS | Status: AC
  Filled 2024-01-17: qty 100

## 2024-01-17 MED ORDER — CEFAZOLIN SODIUM-DEXTROSE 2-4 GM/100ML-% IV SOLN
2.0000 g | Freq: Once | INTRAVENOUS | Status: AC
  Administered 2024-01-17: 2 g via INTRAVENOUS

## 2024-01-17 MED ORDER — LACTATED RINGERS IV SOLN: INTRAVENOUS | Status: DC | PRN

## 2024-01-17 MED ORDER — PHENYLEPHRINE 80 MCG/ML (10ML) SYRINGE FOR IV PUSH (FOR BLOOD PRESSURE SUPPORT)
PREFILLED_SYRINGE | INTRAVENOUS | Status: AC
  Filled 2024-01-17: qty 10

## 2024-01-17 MED ORDER — PROPOFOL 10 MG/ML IV BOLUS
INTRAVENOUS | Status: AC
  Filled 2024-01-17: qty 20

## 2024-01-17 MED ORDER — CEFAZOLIN SODIUM-DEXTROSE 2-4 GM/100ML-% IV SOLN
2.0000 g | Freq: Three times a day (TID) | INTRAVENOUS | Status: AC
  Administered 2024-01-17 – 2024-01-18 (×3): 2 g via INTRAVENOUS
  Filled 2024-01-17 (×3): qty 100

## 2024-01-17 MED ORDER — PROPOFOL 500 MG/50ML IV EMUL
INTRAVENOUS | Status: DC | PRN
  Administered 2024-01-17: 125 ug/kg/min via INTRAVENOUS

## 2024-01-17 MED ORDER — DEXAMETHASONE SODIUM PHOSPHATE 10 MG/ML IJ SOLN
INTRAMUSCULAR | Status: AC
Start: 2024-01-17 — End: ?
  Filled 2024-01-17: qty 1

## 2024-01-17 MED ORDER — METOCLOPRAMIDE HCL 5 MG PO TABS: 5.0000 mg | ORAL_TABLET | Freq: Three times a day (TID) | ORAL | Status: DC | PRN

## 2024-01-17 MED ORDER — MORPHINE SULFATE (PF) 2 MG/ML IV SOLN: 0.5000 mg | INTRAVENOUS | Status: DC | PRN

## 2024-01-17 MED ORDER — ONDANSETRON HCL 4 MG/2ML IJ SOLN
INTRAMUSCULAR | Status: DC | PRN
  Administered 2024-01-17: 4 mg via INTRAVENOUS

## 2024-01-17 MED ORDER — ONDANSETRON HCL 4 MG/2ML IJ SOLN: 4.0000 mg | Freq: Four times a day (QID) | INTRAMUSCULAR | Status: DC | PRN

## 2024-01-17 MED ORDER — 0.9 % SODIUM CHLORIDE (POUR BTL) OPTIME
TOPICAL | Status: DC | PRN
  Administered 2024-01-17: 1000 mL

## 2024-01-17 MED ORDER — LIDOCAINE 2% (20 MG/ML) 5 ML SYRINGE
INTRAMUSCULAR | Status: AC
  Filled 2024-01-17: qty 5

## 2024-01-17 MED ORDER — ONDANSETRON HCL 4 MG/2ML IJ SOLN: 4.0000 mg | Freq: Once | INTRAMUSCULAR | Status: DC | PRN

## 2024-01-17 MED ORDER — SUGAMMADEX SODIUM 200 MG/2ML IV SOLN
INTRAVENOUS | Status: DC | PRN
  Administered 2024-01-17: 197.2 mg via INTRAVENOUS

## 2024-01-17 MED ORDER — FENTANYL CITRATE (PF) 250 MCG/5ML IJ SOLN
INTRAMUSCULAR | Status: DC | PRN
  Administered 2024-01-17 (×2): 50 ug via INTRAVENOUS

## 2024-01-17 MED ORDER — POLYETHYLENE GLYCOL 3350 17 G PO PACK: 17.0000 g | PACK | Freq: Every day | ORAL | Status: DC | PRN

## 2024-01-17 MED ORDER — OXYCODONE HCL 5 MG/5ML PO SOLN: 5.0000 mg | Freq: Once | ORAL | Status: DC | PRN

## 2024-01-17 MED ORDER — CHLORHEXIDINE GLUCONATE 4 % EX SOLN
1.0000 | CUTANEOUS | 1 refills | Status: AC
Start: 2024-01-17 — End: ?

## 2024-01-17 MED ORDER — FENTANYL CITRATE (PF) 100 MCG/2ML IJ SOLN: 25.0000 ug | INTRAMUSCULAR | Status: DC | PRN

## 2024-01-17 MED ORDER — METOCLOPRAMIDE HCL 5 MG/ML IJ SOLN: 5.0000 mg | Freq: Three times a day (TID) | INTRAMUSCULAR | Status: DC | PRN

## 2024-01-17 MED ORDER — DOCUSATE SODIUM 100 MG PO CAPS
100.0000 mg | ORAL_CAPSULE | Freq: Two times a day (BID) | ORAL | Status: DC
  Administered 2024-01-17 – 2024-01-18 (×3): 100 mg via ORAL
  Filled 2024-01-17 (×3): qty 1

## 2024-01-17 MED ORDER — ONDANSETRON HCL 4 MG PO TABS: 4.0000 mg | ORAL_TABLET | Freq: Four times a day (QID) | ORAL | Status: DC | PRN

## 2024-01-17 MED ORDER — SODIUM CHLORIDE 0.9 % IV SOLN: INTRAVENOUS | Status: AC

## 2024-01-17 MED ORDER — PHENYLEPHRINE 80 MCG/ML (10ML) SYRINGE FOR IV PUSH (FOR BLOOD PRESSURE SUPPORT)
PREFILLED_SYRINGE | INTRAVENOUS | Status: DC | PRN
  Administered 2024-01-17: 120 ug via INTRAVENOUS
  Administered 2024-01-17: 80 ug via INTRAVENOUS

## 2024-01-17 MED ORDER — FENTANYL CITRATE (PF) 250 MCG/5ML IJ SOLN
INTRAMUSCULAR | Status: AC
  Filled 2024-01-17: qty 5

## 2024-01-17 MED ORDER — LIDOCAINE 2% (20 MG/ML) 5 ML SYRINGE
INTRAMUSCULAR | Status: DC | PRN
  Administered 2024-01-17: 60 mg via INTRAVENOUS

## 2024-01-17 MED ORDER — PROPOFOL 10 MG/ML IV BOLUS
INTRAVENOUS | Status: DC | PRN
  Administered 2024-01-17: 60 mg via INTRAVENOUS

## 2024-01-17 MED ORDER — OXYCODONE HCL 5 MG PO TABS: 5.0000 mg | ORAL_TABLET | Freq: Once | ORAL | Status: DC | PRN

## 2024-01-17 SURGICAL SUPPLY — 36 items
BIT DRILL INTERTAN LAG SCREW (BIT) IMPLANT
BIT DRILL LONG 4.0 (BIT) IMPLANT
BRUSH SCRUB EZ PLAIN DRY (MISCELLANEOUS) ×2 IMPLANT
CHLORAPREP W/TINT 26 (MISCELLANEOUS) ×1 IMPLANT
COVER PERINEAL POST (MISCELLANEOUS) ×1 IMPLANT
COVER SURGICAL LIGHT HANDLE (MISCELLANEOUS) ×1 IMPLANT
DERMABOND ADVANCED .7 DNX12 (GAUZE/BANDAGES/DRESSINGS) ×1 IMPLANT
DERMABOND ADVANCED .7 DNX6 (GAUZE/BANDAGES/DRESSINGS) IMPLANT
DRAPE C-ARM 35X43 STRL (DRAPES) ×1 IMPLANT
DRAPE IMP U-DRAPE 54X76 (DRAPES) ×2 IMPLANT
DRAPE INCISE IOBAN 66X45 STRL (DRAPES) ×1 IMPLANT
DRAPE STERI IOBAN 125X83 (DRAPES) ×1 IMPLANT
DRAPE SURG 17X23 STRL (DRAPES) ×2 IMPLANT
DRAPE U-SHAPE 47X51 STRL (DRAPES) ×1 IMPLANT
DRESSING MEPILEX FLEX 4X4 (GAUZE/BANDAGES/DRESSINGS) ×1 IMPLANT
DRSG MEPILEX POST OP 4X8 (GAUZE/BANDAGES/DRESSINGS) ×1 IMPLANT
ELECTRODE REM PT RTRN 9FT ADLT (ELECTROSURGICAL) ×1 IMPLANT
GLOVE BIO SURGEON STRL SZ 6.5 (GLOVE) ×3 IMPLANT
GLOVE BIO SURGEON STRL SZ7.5 (GLOVE) ×4 IMPLANT
GLOVE BIOGEL PI IND STRL 6.5 (GLOVE) ×1 IMPLANT
GLOVE BIOGEL PI IND STRL 7.5 (GLOVE) ×1 IMPLANT
GOWN STRL REUS W/ TWL LRG LVL3 (GOWN DISPOSABLE) ×1 IMPLANT
KIT BASIN OR (CUSTOM PROCEDURE TRAY) ×1 IMPLANT
KIT TURNOVER KIT B (KITS) ×1 IMPLANT
MANIFOLD NEPTUNE II (INSTRUMENTS) ×1 IMPLANT
NAIL INTERTAN 10X18 130D 10S (Nail) IMPLANT
NS IRRIG 1000ML POUR BTL (IV SOLUTION) ×1 IMPLANT
PACK GENERAL/GYN (CUSTOM PROCEDURE TRAY) ×1 IMPLANT
PAD ARMBOARD POSITIONER FOAM (MISCELLANEOUS) ×2 IMPLANT
PIN GUIDE 3.2X343MM (PIN) IMPLANT
SCREW LAG COMPR KIT 90/85 (Screw) IMPLANT
SCREW TRIGEN LOW PROF 5.0X40 (Screw) IMPLANT
SUT MNCRL AB 3-0 PS2 18 (SUTURE) ×1 IMPLANT
SUT VIC AB 2-0 CT1 TAPERPNT 27 (SUTURE) ×2 IMPLANT
TOWEL GREEN STERILE (TOWEL DISPOSABLE) ×2 IMPLANT
WATER STERILE IRR 1000ML POUR (IV SOLUTION) ×1 IMPLANT

## 2024-01-17 NOTE — Anesthesia Procedure Notes (Signed)
 Procedure Name: Intubation Date/Time: 01/17/2024 3:28 PM  Performed by: Andee Bamberger, CRNAPre-anesthesia Checklist: Patient identified, Emergency Drugs available, Suction available and Patient being monitored Patient Re-evaluated:Patient Re-evaluated prior to induction Oxygen Delivery Method: Circle system utilized Preoxygenation: Pre-oxygenation with 100% oxygen Induction Type: IV induction Ventilation: Mask ventilation without difficulty Laryngoscope Size: Miller and 2 Grade View: Grade I Tube type: Oral Tube size: 7.0 mm Number of attempts: 1 Airway Equipment and Method: Stylet and Oral airway Placement Confirmation: ETT inserted through vocal cords under direct vision, positive ETCO2 and breath sounds checked- equal and bilateral Secured at: 22 cm Tube secured with: Tape Dental Injury: Teeth and Oropharynx as per pre-operative assessment

## 2024-01-17 NOTE — Anesthesia Preprocedure Evaluation (Addendum)
 Anesthesia Evaluation  Patient identified by MRN, date of birth, ID band Patient confused    Reviewed: Allergy & Precautions, NPO status , Patient's Chart, lab work & pertinent test results  History of Anesthesia Complications Negative for: history of anesthetic complications  Airway Mallampati: Unable to assess  TM Distance: >3 FB     Dental  (+) Dental Advisory Given   Pulmonary    Pulmonary exam normal        Cardiovascular hypertension, Normal cardiovascular exam     Neuro/Psych  PSYCHIATRIC DISORDERS     Dementia  Tardive dyskinesia    GI/Hepatic   Endo/Other  diabetes    Renal/GU CRFRenal disease     Musculoskeletal   Abdominal   Peds  Hematology  (+) Blood dyscrasia, anemia   Anesthesia Other Findings   Reproductive/Obstetrics                             Anesthesia Physical Anesthesia Plan  ASA: 4  Anesthesia Plan: General   Post-op Pain Management: Tylenol PO (pre-op)*   Induction: Intravenous  PONV Risk Score and Plan: 3 and Treatment may vary due to age or medical condition, Ondansetron and TIVA  Airway Management Planned: Oral ETT  Additional Equipment: None  Intra-op Plan:   Post-operative Plan: Extubation in OR  Informed Consent: I have reviewed the patients History and Physical, chart, labs and discussed the procedure including the risks, benefits and alternatives for the proposed anesthesia with the patient or authorized representative who has indicated his/her understanding and acceptance.   Patient has DNR.  Discussed DNR with power of attorney and Suspend DNR.   Dental advisory given and Consent reviewed with POA  Plan Discussed with: CRNA and Anesthesiologist  Anesthesia Plan Comments: (Consent obtained from legal guardian, Danney Dutton, via telephone number listed in facesheet. Discussed DNR as well, which Peterson Brandt has elected to suspend for the  procedure. )        Anesthesia Quick Evaluation

## 2024-01-17 NOTE — Plan of Care (Signed)
  Problem: Activity: Goal: Risk for activity intolerance will decrease Outcome: Progressing   Problem: Skin Integrity: Goal: Risk for impaired skin integrity will decrease Outcome: Progressing   

## 2024-01-17 NOTE — Progress Notes (Addendum)
 Per PA request, surgical consent form emailed to legal guardian Lex Redbird at 1030.  Voicemail left. Baldo Bonds, MSW, LCSW 4/28/202511:44 AM   1230: message from Danney Dutton: they need corrected copy of the consent form.  The first one already has a name printed on the signature line. New copy emailed. Baldo Bonds, MSW, LCSW 4/28/20251:33 PM

## 2024-01-17 NOTE — Progress Notes (Signed)
                  Subjective:   Summary: 88 yo F with advanced dementia brought to the ED from her long term care facility after a ground level fall, found to have communited intertrochanteric R femur fracture.   Patient is similarly minimally verbal and pleasant today. Does not appear to be in any distress at rest or with palpation of R hip  Objective:  Vital signs in last 24 hours: Vitals:   01/16/24 1400 01/16/24 2012 01/17/24 0430 01/17/24 0712  BP: 125/81 (!) 133/100 132/61 (!) 147/62  Pulse: 68 60 (!) 59 82  Resp:  16 16   Temp: 98 F (36.7 C) 98.2 F (36.8 C) 97.7 F (36.5 C) (!) 97.5 F (36.4 C)  TempSrc: Oral Oral Oral Oral  SpO2: 100% 97% 100% 100%   Supplemental O2: Room Air SpO2: 100 %  Physical Exam:  Constitutional: pleasantly demented, minimally verbal. Mostly responds with "yes" Ext: externally rotated R leg, hip nontender to palpation  Assessment/Plan:   R femur intertrochanteric fracture Osteoporosis Consent was not able to be obtained from patient's guardian yesterday. Will try again today. OR scheduling dependent on obtaining consent for surgery.  - NPO since midnight for potential fixation today - Scheduled Tylenol 650mg  q6h  Anxiety: continue home lorazepam 0.5mg  q8h prn Tardive dyskinesia: home valbenazine 40mg  daily Iron deficiency anemia: home ferrous sulfate 325mg  Diabetes mellitus: diet controlled, blood sugar at goal. Q4h CBG checks  Diet: NPO VTE: SCD Code: DNR/DNI on hospice  Dispo: Anticipated discharge to Skilled nursing facility pending medical stability.   Jayson Michael, MD PGY-1 Internal Medicine Resident Pager Number [phone] Please contact the on call pager after 5 pm and on weekends at [phone].

## 2024-01-17 NOTE — Consult Note (Signed)
 Reason for Consult:Right hip fx Referring Physician: Adonica Hoose Time called: 0730 Time at bedside: 0906   Kristin Mccarty is an 88 y.o. female.  HPI: [name] fell at the [facility]. She was brought to the ED where x-rays showed a right hip fx and orthopedic surgery was consulted. She was scheduled for IMN but legal guardian could not be contacted for consent. Orthopedic trauma consultation was requested to try and get her repaired in a timely fashion. She has advanced dementia and cannot contribute to history.  Past Medical History:  Diagnosis Date  . Diabetes mellitus without complication (HCC)     History reviewed. No pertinent surgical history.  History reviewed. No pertinent family history.  Social History:  reports that she has never smoked. She has never used smokeless tobacco. She reports that she does not currently use alcohol. She reports that she does not currently use drugs.  Allergies: No Known Allergies  Medications: I have reviewed the patient's current medications.  Results for orders placed or performed during the hospital encounter of 01/15/24 (from the past 48 hours)  Glucose, capillary     Status: Abnormal   Collection Time: 01/15/24 11:29 AM  Result Value Ref Range   Glucose-Capillary 186 (H) 70 - 99 mg/dL    Comment: Glucose reference range applies only to samples taken after fasting for at least 8 hours.  Surgical pcr screen     Status: Abnormal   Collection Time: 01/15/24 12:01 PM   Specimen: Nasal Mucosa; Nasal Swab  Result Value Ref Range   MRSA, PCR POSITIVE (A) NEGATIVE    Comment: RESULT CALLED TO, READ BACK BY AND VERIFIED WITH: [id number] AT 1745 RN ANGELITO K, ADC    Staphylococcus aureus POSITIVE (A) NEGATIVE    Comment: (NOTE) The Xpert SA Assay (FDA approved for NASAL specimens in patients 32 years of age and older), is one component of a comprehensive surveillance program. It is not intended to diagnose infection nor to guide or  monitor treatment. Performed at [facility], [address] [place], [place] [address]   Glucose, capillary     Status: Abnormal   Collection Time: 01/15/24  4:10 PM  Result Value Ref Range   Glucose-Capillary 207 (H) 70 - 99 mg/dL    Comment: Glucose reference range applies only to samples taken after fasting for at least 8 hours.  Glucose, capillary     Status: Abnormal   Collection Time: 01/15/24  7:42 PM  Result Value Ref Range   Glucose-Capillary 174 (H) 70 - 99 mg/dL    Comment: Glucose reference range applies only to samples taken after fasting for at least 8 hours.  Glucose, capillary     Status: Abnormal   Collection Time: 01/16/24 12:09 AM  Result Value Ref Range   Glucose-Capillary 137 (H) 70 - 99 mg/dL    Comment: Glucose reference range applies only to samples taken after fasting for at least 8 hours.  Glucose, capillary     Status: Abnormal   Collection Time: 01/16/24  4:08 AM  Result Value Ref Range   Glucose-Capillary 128 (H) 70 - 99 mg/dL    Comment: Glucose reference range applies only to samples taken after fasting for at least 8 hours.  Basic metabolic panel     Status: Abnormal   Collection Time: 01/16/24  4:44 AM  Result Value Ref Range   Sodium 135 135 - 145 mmol/L   Potassium 4.6 3.5 - 5.1 mmol/L   Chloride  101 98 - 111 mmol/L   CO2 24 22 - 32 mmol/L   Glucose, Bld 120 (H) 70 - 99 mg/dL    Comment: Glucose reference range applies only to samples taken after fasting for at least 8 hours.   BUN 9 8 - 23 mg/dL   Creatinine, Ser 1.61 0.44 - 1.00 mg/dL   Calcium 8.8 (L) 8.9 - 10.3 mg/dL   GFR, Estimated >09 >60 mL/min    Comment: (NOTE) Calculated using the CKD-EPI Creatinine Equation (2021)    Anion gap 10 5 - 15    Comment: Performed at [facility], [address], [place], [place] [address]  Glucose, capillary     Status: Abnormal   Collection Time: 01/16/24  7:46 AM  Result Value Ref Range   Glucose-Capillary 110 (H) 70 - 99 mg/dL     Comment: Glucose reference range applies only to samples taken after fasting for at least 8 hours.  CBC     Status: Abnormal   Collection Time: 01/16/24 10:00 AM  Result Value Ref Range   WBC 12.6 (H) 4.0 - 10.5 K/uL   RBC 4.41 3.87 - 5.11 MIL/uL   Hemoglobin 11.6 (L) 12.0 - 15.0 g/dL   HCT 81.1 91.4 - 78.2 %   MCV 81.9 80.0 - 100.0 fL   MCH 26.3 26.0 - 34.0 pg   MCHC 32.1 30.0 - 36.0 g/dL   RDW 95.6 (H) 21.3 - 08.6 %   Platelets  150 - 400 K/uL    PLATELET CLUMPS NOTED ON SMEAR, COUNT APPEARS DECREASED    Comment: Immature Platelet Fraction may be clinically indicated, consider ordering this additional test [id number]    nRBC 0.0 0.0 - 0.2 %    Comment: Performed at [facility], [address] [place], [place] [address]  Glucose, capillary     Status: Abnormal   Collection Time: 01/16/24 11:02 AM  Result Value Ref Range   Glucose-Capillary 121 (H) 70 - 99 mg/dL    Comment: Glucose reference range applies only to samples taken after fasting for at least 8 hours.  Glucose, capillary     Status: Abnormal   Collection Time: 01/16/24  4:03 PM  Result Value Ref Range   Glucose-Capillary 106 (H) 70 - 99 mg/dL    Comment: Glucose reference range applies only to samples taken after fasting for at least 8 hours.  Glucose, capillary     Status: Abnormal   Collection Time: 01/16/24  8:10 PM  Result Value Ref Range   Glucose-Capillary 116 (H) 70 - 99 mg/dL    Comment: Glucose reference range applies only to samples taken after fasting for at least 8 hours.  Glucose, capillary     Status: Abnormal   Collection Time: 01/16/24 11:48 PM  Result Value Ref Range   Glucose-Capillary 106 (H) 70 - 99 mg/dL    Comment: Glucose reference range applies only to samples taken after fasting for at least 8 hours.  CBC     Status: Abnormal   Collection Time: 01/17/24  6:13 AM  Result Value Ref Range   WBC 10.8 (H) 4.0 - 10.5 K/uL   RBC 4.21 3.87 - 5.11 MIL/uL   Hemoglobin 11.1 (L) 12.0 - 15.0  g/dL   HCT 13.2 (L) 44.0 - 10.2 %   MCV 82.2 80.0 - 100.0 fL   MCH 26.4 26.0 - 34.0 pg   MCHC 32.1 30.0 - 36.0 g/dL   RDW 72.5 (H) 36.6 - 44.0 %  Platelets 118 (L) 150 - 400 K/uL    Comment: REPEATED TO VERIFY   nRBC 0.0 0.0 - 0.2 %    Comment: Performed at [facility], [address], [place], [place] [address]    DG Knee Right Port Result Date: 01/15/2024 CLINICAL DATA:  [id number] Closed comminuted intertrochanteric fracture of proximal end of right femur (HCC) [id number] EXAM: PORTABLE RIGHT KNEE - 1-2 VIEW COMPARISON:  None Available. FINDINGS: Osteopenia. No fracture or dislocation. No definite effusion. No true lateral radiograph is submitted however, limiting sensitivity. Scattered arterial calcifications. IMPRESSION: 1. Limited, no acute findings. 2. Osteopenia. Electronically Signed   By: Nicoletta Barrier M.D.   On: 01/15/2024 09:50    Review of Systems  Unable to perform ROS: Dementia   Blood pressure (!) 147/62, pulse 82, temperature (!) 97.5 F (36.4 C), temperature source Oral, resp. rate 16, SpO2 100%. Physical Exam Constitutional:      General: She is not in acute distress.    Appearance: She is well-developed. She is not diaphoretic.  HENT:     Head: Normocephalic and atraumatic.  Eyes:     General: No scleral icterus.       Right eye: No discharge.        Left eye: No discharge.     Conjunctiva/sclera: Conjunctivae normal.  Cardiovascular:     Rate and Rhythm: Normal rate and regular rhythm.  Pulmonary:     Effort: Pulmonary effort is normal. No respiratory distress.  Musculoskeletal:     Cervical back: Normal range of motion.     Comments: RLE No traumatic wounds, ecchymosis, or rash  Mod TTP hip  No knee or ankle effusion  Knee stable to varus/ valgus and anterior/posterior stress  Sens DPN, SPN, TN could not assess  Motor EHL, ext, flex, evers grossly intact  DP 2+, PT 2+, No significant edema  Skin:    General: Skin is warm and dry.  Neurological:      Mental Status: She is alert.  Psychiatric:     Comments: Nearly nonverbal    Assessment/Plan: Right hip fx -- Will need IMN once able to get consent. Please keep NPO for now.    Georganna Kin, PA-C Orthopedic Surgery [phone] 01/17/2024, 9:22 AM

## 2024-01-17 NOTE — Op Note (Signed)
 Orthopaedic Surgery Operative Note (CSN: [id number] ) Date of Surgery: 01/17/2024  Admit Date: 01/15/2024   Diagnoses: Pre-Op Diagnoses: Right intertrochanteric femur fracture  Post-Op Diagnosis: Same  Procedures: CPT 27245-Cephalomedullary nailing of right intertrochanteric femur fracture  Surgeons : Primary: Laneta Pintos, MD  Assistant: Alona Jamaica, PA-C  Location: OR 3   Anesthesia: General   Antibiotics: Ancef 2g preop    Tourniquet time: None    Estimated Blood Loss: 100 mL  Complications:* No complications entered in OR log *   Specimens:* No specimens in log *   Implants: Implant Name Type Inv. Item Serial No. Manufacturer Lot No. LRB No. Used Action  NAIL INTERTAN 10X18 130D 10S - [id number] Nail NAIL INTERTAN 10X18 130D 10S  [employer] [id number] Right 1 Implanted  SCREW LAG COMPR KIT 90/85 - [id number] Screw SCREW LAG COMPR KIT 90/85  [facility] [employer] [id number] Right 1 Implanted  SCREW TRIGEN LOW PROF 5.0X40 - [id number] Screw SCREW TRIGEN LOW PROF 5.0X40  [employer] [id number] Right 1 Implanted     Indications for Surgery: 88 year old female who sustained a ground-level fall with a right intertrochanteric femur fracture.  Due to the unstable nature of her injury I recommend proceeding with cephalomedullary nailing of the right hip.  Risk and benefits were discussed with the patient's legal guardian.  Multiple attempts were made to get consent unfortunately it took a while for them to confirm and get consent from [facility].  I was going to deem it an emergency but we received consent just before surgery.  Risks and benefits were discussed.  Operative Findings: Cephalomedullary nailing of the right intertrochanteric femur fracture using [employer] InterTAN 10 x 180 mm nail with a 95 mm lag screw and a 85 mm compression screw.  Procedure: The patient was identified in the preoperative holding area. Consent was  confirmed with the patient and their family and all questions were answered. The operative extremity was marked after confirmation with the patient. she was then brought back to the operating room by our anesthesia colleagues.  She was placed under general anesthetic and carefully transferred over to the [facility] table.  All bony prominences were well-padded.  Traction was applied to the right lower extremity and fluoroscopic imaging showed the unstable nature of her injury.  The right lower extremity was then prepped and draped in usual sterile fashion.  Timeout was performed to verify the patient, the procedure, and the extremity.  Preoperative antibiotics were dosed.  Small incision proximal to the greater trochanter was made and carried down through skin subcutaneous tissue.  A threaded guidewire was directed at the tip of the greater trochanter and advanced into the proximal metaphysis.  An entry reamer was then used to enter the medullary canal.  A 10 x 180 mm nail was then passed down the center canal attached to the targeting arm.  I made an accessory incision and directed a threaded guidewire up into the head/neck segment.  I confirmed adequate tip apex distance using fluoroscopy.  I then measured the length and decided using 90 mm lag screw.  I then drilled the path for the compression screw and placed an antirotation bar.  I then drilled the path for the lag screw and placed the lag screw.  I then compressed approximately 1 cm due to the comminution and distraction of the fracture site.  The proximal portion of the nail was statically locked.  I then used the targeting arm to  place a lateral to medial distal interlocking screw.  The targeting arm was removed and final fluoroscopic imaging was obtained.  The incisions were copiously irrigated.  A layered closure of 2-0 Monocryl and Dermabond was used to close the skin.  Sterile dressings were applied.  The patient was then awoke from anesthesia and taken  to the PACU in stable condition.  Post Op Plan/Instructions: The patient will be weightbearing as tolerated to the right lower extremity.  She will receive postoperative Ancef.  She be placed on 81 mg of aspirin for DVT prophylaxis.  Will have her mobilize with physical and Occupational Therapy.  I was present and performed the entire surgery.  Alona Jamaica, PA-C did assist me throughout the case. An assistant was necessary given the difficulty in approach, maintenance of reduction and ability to instrument the fracture.   Katheryne Pane, MD [facility]

## 2024-01-17 NOTE — H&P (View-Only) (Signed)
 Reason for Consult:Right hip fx Referring Physician: Adonica Hoose Time called: 0730 Time at bedside: 0906   Kristin Mccarty is an 88 y.o. female.  HPI: Kristin Mccarty fell at the SNF where she resides. She was brought to the ED where x-rays showed a right hip fx and orthopedic surgery was consulted. She was scheduled for IMN but legal guardian could not be contacted for consent. Orthopedic trauma consultation was requested to try and get her repaired in a timely fashion. She has advanced dementia and cannot contribute to history.  Past Medical History:  Diagnosis Date  . Diabetes mellitus without complication (HCC)     History reviewed. No pertinent surgical history.  History reviewed. No pertinent family history.  Social History:  reports that she has never smoked. She has never used smokeless tobacco. She reports that she does not currently use alcohol. She reports that she does not currently use drugs.  Allergies: No Known Allergies  Medications: I have reviewed the patient's current medications.  Results for orders placed or performed during the hospital encounter of 01/15/24 (from the past 48 hours)  Glucose, capillary     Status: Abnormal   Collection Time: 01/15/24 11:29 AM  Result Value Ref Range   Glucose-Capillary 186 (H) 70 - 99 mg/dL    Comment: Glucose reference range applies only to samples taken after fasting for at least 8 hours.  Surgical pcr screen     Status: Abnormal   Collection Time: 01/15/24 12:01 PM   Specimen: Nasal Mucosa; Nasal Swab  Result Value Ref Range   MRSA, PCR POSITIVE (A) NEGATIVE    Comment: RESULT CALLED TO, READ BACK BY AND VERIFIED WITH: [id number] AT 1745 RN ANGELITO K, ADC    Staphylococcus aureus POSITIVE (A) NEGATIVE    Comment: (NOTE) The Xpert SA Assay (FDA approved for NASAL specimens in patients 32 years of age and older), is one component of a comprehensive surveillance program. It is not intended to diagnose infection nor to guide or  monitor treatment. Performed at [facility], [address] [place], [place] [address]   Glucose, capillary     Status: Abnormal   Collection Time: 01/15/24  4:10 PM  Result Value Ref Range   Glucose-Capillary 207 (H) 70 - 99 mg/dL    Comment: Glucose reference range applies only to samples taken after fasting for at least 8 hours.  Glucose, capillary     Status: Abnormal   Collection Time: 01/15/24  7:42 PM  Result Value Ref Range   Glucose-Capillary 174 (H) 70 - 99 mg/dL    Comment: Glucose reference range applies only to samples taken after fasting for at least 8 hours.  Glucose, capillary     Status: Abnormal   Collection Time: 01/16/24 12:09 AM  Result Value Ref Range   Glucose-Capillary 137 (H) 70 - 99 mg/dL    Comment: Glucose reference range applies only to samples taken after fasting for at least 8 hours.  Glucose, capillary     Status: Abnormal   Collection Time: 01/16/24  4:08 AM  Result Value Ref Range   Glucose-Capillary 128 (H) 70 - 99 mg/dL    Comment: Glucose reference range applies only to samples taken after fasting for at least 8 hours.  Basic metabolic panel     Status: Abnormal   Collection Time: 01/16/24  4:44 AM  Result Value Ref Range   Sodium 135 135 - 145 mmol/L   Potassium 4.6 3.5 - 5.1 mmol/L   Chloride  101 98 - 111 mmol/L   CO2 24 22 - 32 mmol/L   Glucose, Bld 120 (H) 70 - 99 mg/dL    Comment: Glucose reference range applies only to samples taken after fasting for at least 8 hours.   BUN 9 8 - 23 mg/dL   Creatinine, Ser 1.61 0.44 - 1.00 mg/dL   Calcium 8.8 (L) 8.9 - 10.3 mg/dL   GFR, Estimated >09 >60 mL/min    Comment: (NOTE) Calculated using the CKD-EPI Creatinine Equation (2021)    Anion gap 10 5 - 15    Comment: Performed at [facility], [address], [place], [place] [address]  Glucose, capillary     Status: Abnormal   Collection Time: 01/16/24  7:46 AM  Result Value Ref Range   Glucose-Capillary 110 (H) 70 - 99 mg/dL     Comment: Glucose reference range applies only to samples taken after fasting for at least 8 hours.  CBC     Status: Abnormal   Collection Time: 01/16/24 10:00 AM  Result Value Ref Range   WBC 12.6 (H) 4.0 - 10.5 K/uL   RBC 4.41 3.87 - 5.11 MIL/uL   Hemoglobin 11.6 (L) 12.0 - 15.0 g/dL   HCT 81.1 91.4 - 78.2 %   MCV 81.9 80.0 - 100.0 fL   MCH 26.3 26.0 - 34.0 pg   MCHC 32.1 30.0 - 36.0 g/dL   RDW 95.6 (H) 21.3 - 08.6 %   Platelets  150 - 400 K/uL    PLATELET CLUMPS NOTED ON SMEAR, COUNT APPEARS DECREASED    Comment: Immature Platelet Fraction may be clinically indicated, consider ordering this additional test [id number]    nRBC 0.0 0.0 - 0.2 %    Comment: Performed at [facility], [address] [place], [place] [address]  Glucose, capillary     Status: Abnormal   Collection Time: 01/16/24 11:02 AM  Result Value Ref Range   Glucose-Capillary 121 (H) 70 - 99 mg/dL    Comment: Glucose reference range applies only to samples taken after fasting for at least 8 hours.  Glucose, capillary     Status: Abnormal   Collection Time: 01/16/24  4:03 PM  Result Value Ref Range   Glucose-Capillary 106 (H) 70 - 99 mg/dL    Comment: Glucose reference range applies only to samples taken after fasting for at least 8 hours.  Glucose, capillary     Status: Abnormal   Collection Time: 01/16/24  8:10 PM  Result Value Ref Range   Glucose-Capillary 116 (H) 70 - 99 mg/dL    Comment: Glucose reference range applies only to samples taken after fasting for at least 8 hours.  Glucose, capillary     Status: Abnormal   Collection Time: 01/16/24 11:48 PM  Result Value Ref Range   Glucose-Capillary 106 (H) 70 - 99 mg/dL    Comment: Glucose reference range applies only to samples taken after fasting for at least 8 hours.  CBC     Status: Abnormal   Collection Time: 01/17/24  6:13 AM  Result Value Ref Range   WBC 10.8 (H) 4.0 - 10.5 K/uL   RBC 4.21 3.87 - 5.11 MIL/uL   Hemoglobin 11.1 (L) 12.0 - 15.0  g/dL   HCT 13.2 (L) 44.0 - 10.2 %   MCV 82.2 80.0 - 100.0 fL   MCH 26.4 26.0 - 34.0 pg   MCHC 32.1 30.0 - 36.0 g/dL   RDW 72.5 (H) 36.6 - 44.0 %  Platelets 118 (L) 150 - 400 K/uL    Comment: REPEATED TO VERIFY   nRBC 0.0 0.0 - 0.2 %    Comment: Performed at [facility], [address], [place], [place] [address]    DG Knee Right Port Result Date: 01/15/2024 CLINICAL DATA:  [id number] Closed comminuted intertrochanteric fracture of proximal end of right femur (HCC) [id number] EXAM: PORTABLE RIGHT KNEE - 1-2 VIEW COMPARISON:  None Available. FINDINGS: Osteopenia. No fracture or dislocation. No definite effusion. No true lateral radiograph is submitted however, limiting sensitivity. Scattered arterial calcifications. IMPRESSION: 1. Limited, no acute findings. 2. Osteopenia. Electronically Signed   By: Nicoletta Barrier M.D.   On: 01/15/2024 09:50    Review of Systems  Unable to perform ROS: Dementia   Blood pressure (!) 147/62, pulse 82, temperature (!) 97.5 F (36.4 C), temperature source Oral, resp. rate 16, SpO2 100%. Physical Exam Constitutional:      General: She is not in acute distress.    Appearance: She is well-developed. She is not diaphoretic.  HENT:     Head: Normocephalic and atraumatic.  Eyes:     General: No scleral icterus.       Right eye: No discharge.        Left eye: No discharge.     Conjunctiva/sclera: Conjunctivae normal.  Cardiovascular:     Rate and Rhythm: Normal rate and regular rhythm.  Pulmonary:     Effort: Pulmonary effort is normal. No respiratory distress.  Musculoskeletal:     Cervical back: Normal range of motion.     Comments: RLE No traumatic wounds, ecchymosis, or rash  Mod TTP hip  No knee or ankle effusion  Knee stable to varus/ valgus and anterior/posterior stress  Sens DPN, SPN, TN could not assess  Motor EHL, ext, flex, evers grossly intact  DP 2+, PT 2+, No significant edema  Skin:    General: Skin is warm and dry.  Neurological:      Mental Status: She is alert.  Psychiatric:     Comments: Nearly nonverbal    Assessment/Plan: Right hip fx -- Will need IMN once able to get consent. Please keep NPO for now.    Georganna Kin, PA-C Orthopedic Surgery [phone] 01/17/2024, 9:22 AM

## 2024-01-17 NOTE — Care Management Important Message (Signed)
 Important Message  Patient Details  Name: Kristin Mccarty MRN: [id number] Date of Birth: May 12, 1936   Important Message Given:  Yes - Medicare IM     Felix Host 01/17/2024, 2:34 PM

## 2024-01-17 NOTE — Plan of Care (Signed)
  Problem: Education: Goal: Knowledge of General Education information will improve Description: Including pain rating scale, medication(s)/side effects and non-pharmacologic comfort measures Outcome: Not Progressing   Problem: Nutrition: Goal: Adequate nutrition will be maintained Outcome: Progressing   Problem: Pain Managment: Goal: General experience of comfort will improve and/or be controlled Outcome: Progressing

## 2024-01-17 NOTE — Interval H&P Note (Signed)
 History and Physical Interval Note:  01/17/2024 3:08 PM  Kristin Mccarty  has presented today for surgery, with the diagnosis of Right intertrochanteric femur fracture.  The various methods of treatment have been discussed with the patient and family. After consideration of risks, benefits and other options for treatment, the patient has consented to  Procedure(s): FIXATION, FRACTURE, INTERTROCHANTERIC, WITH INTRAMEDULLARY ROD (Right) as a surgical intervention.  The patient's history has been reviewed, patient examined, no change in status, stable for surgery.  I have reviewed the patient's chart and labs.  Questions were answered to the patient's satisfaction.     [name] P Brasen Bundren

## 2024-01-17 NOTE — Transfer of Care (Signed)
 Immediate Anesthesia Transfer of Care Note  Patient: Kristin Mccarty  Procedure(s) Performed: FIXATION, FRACTURE, INTERTROCHANTERIC, WITH INTRAMEDULLARY ROD (Right)  Patient Location: PACU  Anesthesia Type:General  Level of Consciousness: drowsy  Airway & Oxygen Therapy: Patient Spontanous Breathing and Patient connected to face mask oxygen  Post-op Assessment: Report given to RN and Post -op Vital signs reviewed and stable  Post vital signs: Reviewed and stable  Last Vitals:  Vitals Value Taken Time  BP 156/62 01/17/24 1624  Temp    Pulse 64 01/17/24 1626  Resp 16 01/17/24 1626  SpO2 100 % 01/17/24 1626  Vitals shown include unfiled device data.  Last Pain:  Vitals:   01/17/24 1330  TempSrc: Oral  PainSc:          Complications: No notable events documented.

## 2024-01-17 NOTE — Progress Notes (Signed)
 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 She suffered a fall on 4.25 and [facility] was notified and a visit made. X-ray was ordered and resulted in dx of right femur fracture and decision was made for patient to be transported to the hospital. She has been admitted on 4.26 with diagnosis right femur fracture with plan for surgical fixation likely tomorrow. Per Dr. Tessie Fila with [facility] this is a related hospital admission.    Visited patient at bedside. She was pleasantly conversational and had no complaints of pain or discomfort. Per bedside nurse, patient is NPO and ready for surgical procedure today.    Patient remains inpatient appropriate awaiting surgical fixation of right femur fracture.   V/S: 97.5/82/16  147/62  100% on room air  I&O: 360/50   Abnormal Labs: 01/17/24 06:13 WBC: 10.8 (H) Hemoglobin: 11.1 (L) HCT: 34.6 (L) RDW: 16.6 (H) Platelets: 118 (L)   Diagnostics: None new   IV/PRN Meds: None new   Assessment/Plan per Dr Jayson Michael note 4.28.2025 R femur intertrochanteric fracture Osteoporosis Consent was not able to be obtained from patient's guardian yesterday. Will try again today. OR scheduling dependent on obtaining consent for surgery.  - NPO since midnight for potential fixation today - Scheduled Tylenol 650mg  q6h   Anxiety: continue home lorazepam 0.5mg  q8h prn Tardive dyskinesia: home valbenazine 40mg  daily Iron deficiency anemia: home ferrous sulfate 325mg  Diabetes mellitus: diet controlled, blood sugar at goal. Q4h CBG checks   Discharge Planning:? Ongoing  Family contact: Spoke with guardian Danney Dutton via phone number [phone]. She advised that consent paperwork has been received and will need to be signed by her supervisor. She anticipates this being done today.   IDT:? Updated?    Goals of Care: Ongoing, DNR   If patient requires ambulance transport at discharge, please use [facility] as we contract this service for our patients.    Please call with any hospice related questions or concerns.   Thank you, Ardine Beckwith, LPN [facility] Liaison [phone]

## 2024-01-18 ENCOUNTER — Encounter (HOSPITAL_COMMUNITY): Payer: Self-pay | Admitting: Student

## 2024-01-18 ENCOUNTER — Other Ambulatory Visit: Payer: Self-pay

## 2024-01-18 DIAGNOSIS — S72141A Displaced intertrochanteric fracture of right femur, initial encounter for closed fracture: Secondary | ICD-10-CM | POA: Diagnosis not present

## 2024-01-18 DIAGNOSIS — Z8659 Personal history of other mental and behavioral disorders: Secondary | ICD-10-CM | POA: Diagnosis not present

## 2024-01-18 DIAGNOSIS — W19XXXA Unspecified fall, initial encounter: Secondary | ICD-10-CM | POA: Diagnosis not present

## 2024-01-18 LAB — BASIC METABOLIC PANEL WITH GFR
Anion gap: 11 (ref 5–15)
Anion gap: 7 (ref 5–15)
BUN: 16 mg/dL (ref 8–23)
BUN: 15 mg/dL (ref 8–23)
CO2: 21 mmol/L — ABNORMAL LOW (ref 22–32)
CO2: 25 mmol/L (ref 22–32)
Calcium: 8.1 mg/dL — ABNORMAL LOW (ref 8.9–10.3)
Calcium: 8.4 mg/dL — ABNORMAL LOW (ref 8.9–10.3)
Chloride: 101 mmol/L (ref 98–111)
Chloride: 101 mmol/L (ref 98–111)
Creatinine, Ser: 0.77 mg/dL (ref 0.44–1.00)
Creatinine, Ser: 0.86 mg/dL (ref 0.44–1.00)
GFR, Estimated: >60 mL/min (ref >60)
GFR, Estimated: >60 mL/min (ref >60)
Glucose, Bld: 139 mg/dL — ABNORMAL HIGH (ref 70–99)
Glucose, Bld: 147 mg/dL — ABNORMAL HIGH (ref 70–99)
Potassium: 5.9 mmol/L — ABNORMAL HIGH (ref 3.5–5.1)
Potassium: 4.4 mmol/L (ref 3.5–5.1)
Sodium: 133 mmol/L — ABNORMAL LOW (ref 135–145)
Sodium: 133 mmol/L — ABNORMAL LOW (ref 135–145)

## 2024-01-18 LAB — CBC
HCT: 28.6 % — ABNORMAL LOW (ref 36.0–46.0)
Hemoglobin: 9 g/dL — ABNORMAL LOW (ref 12.0–15.0)
MCH: 26.3 pg (ref 26.0–34.0)
MCHC: 31.5 g/dL (ref 30.0–36.0)
MCV: 83.6 fL (ref 80.0–100.0)
Platelets: 133 10*3/uL — ABNORMAL LOW (ref 150–400)
RBC: 3.42 MIL/uL — ABNORMAL LOW (ref 3.87–5.11)
RDW: 16.4 % — ABNORMAL HIGH (ref 11.5–15.5)
WBC: 10.7 10*3/uL — ABNORMAL HIGH (ref 4.0–10.5)
nRBC: 0 % (ref 0.0–0.2)

## 2024-01-18 LAB — GLUCOSE, CAPILLARY
Glucose-Capillary: 119 mg/dL — ABNORMAL HIGH (ref 70–99)
Glucose-Capillary: 128 mg/dL — ABNORMAL HIGH (ref 70–99)
Glucose-Capillary: 132 mg/dL — ABNORMAL HIGH (ref 70–99)
Glucose-Capillary: 133 mg/dL — ABNORMAL HIGH (ref 70–99)
Glucose-Capillary: 136 mg/dL — ABNORMAL HIGH (ref 70–99)
Glucose-Capillary: 186 mg/dL — ABNORMAL HIGH (ref 70–99)

## 2024-01-18 MED ORDER — TRAMADOL HCL 50 MG PO TABS: 50.0000 mg | ORAL_TABLET | Freq: Four times a day (QID) | ORAL | 0 refills | Status: DC | PRN

## 2024-01-18 MED ORDER — ACETAMINOPHEN 325 MG PO TABS
650.0000 mg | ORAL_TABLET | Freq: Four times a day (QID) | ORAL | Status: AC | PRN
Start: 2024-01-18 — End: ?

## 2024-01-18 MED ORDER — ENOXAPARIN SODIUM 40 MG/0.4ML IJ SOSY
40.0000 mg | PREFILLED_SYRINGE | INTRAMUSCULAR | Status: DC
Start: 2024-01-18 — End: 2024-01-19
  Administered 2024-01-18: 40 mg via SUBCUTANEOUS
  Filled 2024-01-18: qty 0.4

## 2024-01-18 MED ORDER — ASPIRIN 81 MG PO TBEC: 81.0000 mg | DELAYED_RELEASE_TABLET | Freq: Every day | ORAL | 0 refills | Status: AC

## 2024-01-18 NOTE — Evaluation (Signed)
 Physical Therapy Evaluation Patient Details Name: Kristin Mccarty MRN: [id number] DOB: 15-Dec-1935 Today's Date: 01/18/2024  History of Present Illness  Pt is a 88 y/o female from [facility] presenting on 4/26 with R hip pain after fall.   S/P cephalomedullary nailing of R intertrochanteric femur fx. PMH includes: dementia, DM2, osteoporosis.  Clinical Impression  Pt admitted with above. Per chart pt has been a resident at [facility] since 2011. Pt poor historian and unable to carry on conversation due to pt with advanced dementia. Pt tolerated mobility well this date and did not appear to be in any pain, however did require maxAX2 for OOB transfers. Suspected pt needed considerable assist from RN staff PTA however unsure to what level. Pt to benefit from inpatient rehab program < 3 hrs a day to improve mobility to decrease burden of care on caregivers. Acute PT to cont to follow.        If plan is discharge home, recommend the following:     Can travel by private vehicle   No    Equipment Recommendations None recommended by PT  Recommendations for Other Services       Functional Status Assessment       Precautions / Restrictions Precautions Precautions: Fall Restrictions Weight Bearing Restrictions Per Provider Order: Yes RLE Weight Bearing Per Provider Order: Weight bearing as tolerated      Mobility  Bed Mobility Overal bed mobility: Needs Assistance Bed Mobility: Supine to Sit     Supine to sit: Max assist, +2 for physical assistance     General bed mobility comments: pt able to initate L LE towards EOB, reach with R UE to pull self using rail. overall max assist +2 to come upright at eOB    Transfers Overall transfer level: Needs assistance Equipment used: 2 person hand held assist Transfers: Sit to/from Stand, Bed to chair/wheelchair/BSC Sit to Stand: Total assist, +2 physical assistance Stand pivot transfers: Total assist, +2 physical  assistance         General transfer comment: bil hand held assist to fully power up and pivot to recliner, pt requiring physical assist to weight shift to slide feet to chair, pt unable to pick up feet    Ambulation/Gait               General Gait Details: unable  Stairs            Wheelchair Mobility     Tilt Bed    Modified Rankin (Stroke Patients Only)       Balance Overall balance assessment: Needs assistance Sitting-balance support: No upper extremity supported, Feet supported Sitting balance-Leahy Scale: Fair Sitting balance - Comments: min guard statically for safety   Standing balance support: Bilateral upper extremity supported, During functional activity Standing balance-Leahy Scale: Zero Standing balance comment: relies on BUE and external support                             Pertinent Vitals/Pain Pain Assessment Pain Assessment: Faces Faces Pain Scale: No hurt Pain Intervention(s): Monitored during session    Home Living Family/patient expects to be discharged to:: Skilled nursing facility                   Additional Comments: resident at [facility]    Prior Function Prior Level of Function : Patient poor historian/Family not available;Needs assist  Mobility Comments: per chart typically uses wheelchair ADLs Comments: anticipate total care for ADLS     Extremity/Trunk Assessment   Upper Extremity Assessment Upper Extremity Assessment: Defer to OT evaluation    Lower Extremity Assessment Lower Extremity Assessment: Generalized weakness    Cervical / Trunk Assessment Cervical / Trunk Assessment: Kyphotic  Communication   Communication Communication: Impaired Factors Affecting Communication: Difficulty expressing self;Reduced clarity of speech    Cognition Arousal: Alert Behavior During Therapy: Flat affect   PT - Cognitive impairments: History of cognitive impairments                        PT - Cognition Comments: pt with advanced dementia and under hospice care. Pt pleasant but unable to carry on conversation and is only oriented to self Following commands: Impaired Following commands impaired: Follows one step commands inconsistently     Cueing Cueing Techniques: Verbal cues, Visual cues, Gestural cues, Tactile cues     General Comments General comments (skin integrity, edema, etc.): VSS, R hip incision intact with dressing    Exercises     Assessment/Plan    PT Assessment Patient needs continued PT services  PT Problem List Decreased strength;Decreased range of motion;Decreased balance;Decreased activity tolerance;Decreased cognition;Decreased coordination;Decreased mobility       PT Treatment Interventions DME instruction;Gait training;Stair training;Functional mobility training;Therapeutic activities;Therapeutic exercise    PT Goals (Current goals can be found in the Care Plan section)  Acute Rehab PT Goals Patient Stated Goal: didn't state PT Goal Formulation: Patient unable to participate in goal setting Time For Goal Achievement: 02/01/24 Potential to Achieve Goals: Fair    Frequency Min 2X/week     Co-evaluation PT/OT/SLP Co-Evaluation/Treatment: Yes Reason for Co-Treatment: For patient/therapist safety;To address functional/ADL transfers;Necessary to address cognition/behavior during functional activity PT goals addressed during session: Mobility/safety with mobility OT goals addressed during session: ADL's and self-care       AM-PAC PT "6 Clicks" Mobility  Outcome Measure Help needed turning from your back to your side while in a flat bed without using bedrails?: A Lot Help needed moving from lying on your back to sitting on the side of a flat bed without using bedrails?: A Lot Help needed moving to and from a bed to a chair (including a wheelchair)?: Total Help needed standing up from a chair using your arms (e.g.,  wheelchair or bedside chair)?: Total Help needed to walk in hospital room?: Total Help needed climbing 3-5 steps with a railing? : Total 6 Click Score: 8    End of Session Equipment Utilized During Treatment: Gait belt Activity Tolerance: Patient tolerated treatment well Patient left: in chair;with chair alarm set;with call bell/phone within reach Nurse Communication: Mobility status (use steady to transfer pt back to bed) PT Visit Diagnosis: Difficulty in walking, not elsewhere classified (R26.2)    Time: 1610-9604 PT Time Calculation (min) (ACUTE ONLY): 22 min   Charges:   PT Evaluation $PT Eval Moderate Complexity: 1 Mod   PT General Charges $$ ACUTE PT VISIT: 1 Visit         Renaee Caro, PT, DPT Acute Rehabilitation Services Secure chat preferred Office #: [phone]   Jenna Moan 01/18/2024, 12:59 PM

## 2024-01-18 NOTE — Plan of Care (Signed)
  Problem: Clinical Measurements: Goal: Ability to maintain clinical measurements within normal limits will improve Outcome: Progressing   Problem: Activity: Goal: Risk for activity intolerance will decrease Outcome: Progressing   Problem: Pain Managment: Goal: General experience of comfort will improve and/or be controlled Outcome: Progressing   Problem: Safety: Goal: Ability to remain free from injury will improve Outcome: Progressing

## 2024-01-18 NOTE — Anesthesia Postprocedure Evaluation (Signed)
 Anesthesia Post Note  Patient: Jakima Scuderi  Procedure(s) Performed: FIXATION, FRACTURE, INTERTROCHANTERIC, WITH INTRAMEDULLARY ROD (Right)     Patient location during evaluation: PACU Anesthesia Type: General Level of consciousness: confused (at baseline) Pain management: pain level controlled Vital Signs Assessment: post-procedure vital signs reviewed and stable Respiratory status: spontaneous breathing, nonlabored ventilation and respiratory function stable Cardiovascular status: stable and blood pressure returned to baseline Anesthetic complications: no   No notable events documented.  Last Vitals:  Vitals:   01/18/24 0040 01/18/24 0400  BP: (!) 105/58 105/63  Pulse: (!) 56 62  Resp: 15 16  Temp: (!) 36.3 C (!) 36.3 C  SpO2: 100% 100%    Last Pain:  Vitals:   01/18/24 0400  TempSrc: Oral  PainSc:                  Juventino Oppenheim

## 2024-01-18 NOTE — Plan of Care (Signed)

## 2024-01-18 NOTE — Progress Notes (Signed)
 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 She suffered a fall on 4.25 and [facility] was notified and a visit made. X-ray was ordered and resulted in dx of right femur fracture and decision was made for patient to be transported to the hospital. She has been admitted on 4.26 with diagnosis right femur fracture with plan for surgical fixation likely tomorrow. Per Dr. Tessie Fila with [facility] this is a related hospital admission.    Visited patient who was sitting up in the chair and eating lunch with the assistance of bedside nurse. Patient had no complaints. Nurse advised patient has been doing very well post-surgery. Per MD note, patient will likely discharge back to facility tomorrow.    Patient remains inpatient appropriate post-surgical fixation of right femur fracture.   V/S: 97.01/04/70  113/78  95% on room air   I&O: 450/100   Abnormal Labs: 01/18/24 11:35 BASIC METABOLIC PANEL WITH GFR: Rpt ! Sodium: 133 (L) Glucose: 147 (H) Calcium: 8.4 (L)   Diagnostics:  CLINICAL DATA:  Elective surgery.   EXAM: RIGHT FEMUR 2 VIEWS   COMPARISON:  Preoperative imaging   FINDINGS: Six fluoroscopic spot views of the right proximal femur submitted from the operating room. Sequential imaging during femoral intramedullary nail, trans trochanteric and distal locking screw fixation traversing proximal femur fracture. Fluoroscopy time 55.6 seconds. Dose 5.83 mGy   IMPRESSION: Intraoperative fluoroscopy during proximal femur fracture fixation.    Electronically Signed   By: Chadwick Colonel M.D.   On: 01/17/2024 16:51   CLINICAL DATA:  Fracture, postop.   EXAM: DG HIP (WITH OR WITHOUT PELVIS) 1V PORT RIGHT   COMPARISON:  Preoperative imaging.   FINDINGS: Femoral intramedullary nail with trans trochanteric and distal locking  screw fixation traverse proximal femur fracture. Improved fracture alignment from preoperative imaging. Recent postsurgical change includes air and edema in the soft tissues.   IMPRESSION: ORIF of proximal femur fracture. No immediate postoperative complication.    Electronically Signed   By: Chadwick Colonel M.D.   On: 01/17/2024 18:44   IV/PRN Meds: cefazolin 2g IV x2, Ativan 0.5mg  PO x2, Tramadol 100mg  PO x2   Assessment/Plan per Dr Jayson Michael note 4.29.2025 R femur intertrochanteric fracture Osteoporosis S/p cephalomedullary nailing of right intertrochanteric femur fracture by orthopedic surgery on 4/29. Per ortho, patient will need PT/OT evaluation and is clear for discharge to SNF from their perspective.  - PT/OT evaluation today - Likely discharge to ALF tomorrow - Scheduled Tylenol 650mg  q6h, tramadol 50-100mg  q6h prn   Anxiety: continue home lorazepam 0.5mg  q8h prn Tardive dyskinesia: home valbenazine 40mg  daily Iron deficiency anemia: home ferrous sulfate 325mg  Diabetes mellitus: diet controlled, blood sugar at goal. Q4h CBG checks   Discharge Planning:? Back to facility likely tomorrow   Family contact: Attempted to speak with guardian Danney Dutton by phone. Left message for return call.   IDT:? Updated?   Goals of Care: Ongoing, DNR   If patient requires ambulance transport at discharge, please use [facility] as we contract this service for our patients.    Please call with any hospice related questions or concerns.   Thank you, Ardine Beckwith, LPN [facility] Liaison [phone]

## 2024-01-18 NOTE — Progress Notes (Signed)
 Orthopaedic Trauma Progress Note  SUBJECTIVE: Doing well this morning.  Notes minimal pain in the right hip.  No chest pain. No SOB. No nausea/vomiting. No other complaints.  No family at bedside currently  OBJECTIVE:  Vitals:   01/18/24 0400 01/18/24 0831  BP: 105/63 113/78  Pulse: 62 71  Resp: 16 16  Temp: (!) 97.4 F (36.3 C) (!) 97.4 F (36.3 C)  SpO2: 100% 95%    Opiates Today (MME): Today's  total administered Morphine Milligram Equivalents: 10 Opiates Yesterday (MME): Yesterday's total administered Morphine Milligram Equivalents: 40  General: Sitting up in bed, no acute distress Respiratory: No increased work of breathing.  Operative Extremity (RLE): Dressings clean, dry, intact.  Mildly tender with palpation over the hip as expected.  Less tender throughout the thigh or lower leg.  No significant calf tenderness.  Able to dorsiflex/plantarflex ankle.  Able to wiggle to the toes. + DP pulse  IMAGING: Stable post op imaging.   LABS:  Results for orders placed or performed during the hospital encounter of 01/15/24 (from the past 24 hours)  Glucose, capillary     Status: None   Collection Time: 01/17/24 11:12 AM  Result Value Ref Range   Glucose-Capillary 87 70 - 99 mg/dL  Glucose, capillary     Status: Abnormal   Collection Time: 01/17/24  4:27 PM  Result Value Ref Range   Glucose-Capillary 131 (H) 70 - 99 mg/dL  Glucose, capillary     Status: Abnormal   Collection Time: 01/17/24  6:04 PM  Result Value Ref Range   Glucose-Capillary 145 (H) 70 - 99 mg/dL  VITAMIN D 25 Hydroxy (Vit-D Deficiency, Fractures)     Status: None   Collection Time: 01/17/24  6:31 PM  Result Value Ref Range   Vit D, 25-Hydroxy 48.19 30 - 100 ng/mL  Glucose, capillary     Status: Abnormal   Collection Time: 01/17/24  9:29 PM  Result Value Ref Range   Glucose-Capillary 167 (H) 70 - 99 mg/dL  Glucose, capillary     Status: Abnormal   Collection Time: 01/18/24 12:52 AM  Result Value Ref Range    Glucose-Capillary 136 (H) 70 - 99 mg/dL  Glucose, capillary     Status: Abnormal   Collection Time: 01/18/24  4:00 AM  Result Value Ref Range   Glucose-Capillary 128 (H) 70 - 99 mg/dL  Basic metabolic panel     Status: Abnormal   Collection Time: 01/18/24  6:36 AM  Result Value Ref Range   Sodium 133 (L) 135 - 145 mmol/L   Potassium 5.9 (H) 3.5 - 5.1 mmol/L   Chloride 101 98 - 111 mmol/L   CO2 21 (L) 22 - 32 mmol/L   Glucose, Bld 139 (H) 70 - 99 mg/dL   BUN 16 8 - 23 mg/dL   Creatinine, Ser 0.98 0.44 - 1.00 mg/dL   Calcium 8.1 (L) 8.9 - 10.3 mg/dL   GFR, Estimated >11 >91 mL/min   Anion gap 11 5 - 15    ASSESSMENT: Kristin Mccarty is a 88 y.o. female, 1 Day Post-Op s/p mechanical fall Procedures: CEPHALOMEDULLARY NAILING RIGHT INTERTROCHANTERIC FEMUR FRACTURE  CV/Blood loss: Hemoglobin 11.1 preoperatively.  CBC pending this a.m.  Hemodynamically stable  PLAN: Weightbearing: WBAT RLE ROM: Unrestricted ROM Incisional and dressing care: Reinforce dressings as needed  Showering: Okay to begin showering/getting incisions wet 01/20/2024 Orthopedic device(s): None  Pain management:  1. Tylenol 650 mg q 6 hours scheduled 2. Tramadol 50-100 mg every 6  hours PRN VTE prophylaxis: Aspirin 81 mg, SCDs ID:  Ancef 2gm post op Foley/Lines:  No foley, KVO IVFs Impediments to Fracture Healing: Vitamin D level 48, no additional supplementation needed Dispo: PT/OT evaluation today, return to SNF once medically stable.  Okay for discharge from ortho standpoint once cleared by medicine team and therapies  D/C recommendations: - Tramadol and Tylenol for pain control - Aspirin 81 mg daily x 30 days for DVT prophylaxis - No additional need for Vit D supplementation  Follow - up plan: 2 weeks after d/c for wound check and repeat x-rays   Contact information:  Katheryne Pane MD, Alona Jamaica PA-C. After hours and holidays please check [email or web address] for group call information for [facility]   Edilia Gordon, PA-C [phone] (office) [email or web address]

## 2024-01-18 NOTE — Evaluation (Addendum)
 Occupational Therapy Evaluation Patient Details Name: Ariahna Hankerson MRN: [id number] DOB: [birth year] Today's Date: 01/18/2024   History of Present Illness   Pt is a 88 y/o female from [facility] presenting on 4/26 with R hip pain after fall.   S/P cephalomedullary nailing of R intertrochanteric femur fx. PMH includes: dementia, DM2, osteoporosis.     Clinical Impressions Patient admitted for above and presents with problem list below.  Patient from [facility] and unable to provide PLOF due to hx of dementia.  Anticipate she needed assist for ADLs, and per chart was typically using WC for mobility.  Patient currently requires max assist +2 for bed mobility, total assist +2 for transfers and ADLs with max-total assist from supine/EOB.  She is pleasantly confused, is able to state her name but does not verbalize much. Based on performance today, believe patient will best benefit from continued OT services acutely and after dc at inpatient setting with <3hrs/day to optimize independence, safety with ADLs and mobility.      If plan is discharge home, recommend the following:   Two people to help with walking and/or transfers;Two people to help with bathing/dressing/bathroom;Assistance with cooking/housework;Assist for transportation;Help with stairs or ramp for entrance;Supervision due to cognitive status     Functional Status Assessment   Patient has had a recent decline in their functional status and demonstrates the ability to make significant improvements in function in a reasonable and predictable amount of time.     Equipment Recommendations   Other (comment) (defer)     Recommendations for Other Services         Precautions/Restrictions   Precautions Precautions: Fall Restrictions Weight Bearing Restrictions Per Provider Order: Yes RLE Weight Bearing Per Provider Order: Weight bearing as tolerated     Mobility Bed Mobility Overal bed mobility:  Needs Assistance Bed Mobility: Supine to Sit     Supine to sit: Max assist, +2 for physical assistance     General bed mobility comments: pt able to initate L LE towards EOB, reach with R UE to pull self using rail. overall max assist +2 to come upright at eOB    Transfers Overall transfer level: Needs assistance Equipment used: 2 person hand held assist Transfers: Sit to/from Stand, Bed to chair/wheelchair/BSC Sit to Stand: Total assist, +2 physical assistance Stand pivot transfers: Total assist, +2 physical assistance         General transfer comment: bil hand held assist to fully power up and pivot to recliner      Balance Overall balance assessment: Needs assistance Sitting-balance support: No upper extremity supported, Feet supported Sitting balance-Leahy Scale: Fair Sitting balance - Comments: min guard statically for safety   Standing balance support: Bilateral upper extremity supported, During functional activity Standing balance-Leahy Scale: Zero Standing balance comment: relies on BUE and external support                           ADL either performed or assessed with clinical judgement   ADL Overall ADL's : Needs assistance/impaired     Grooming: Wash/dry face;Maximal assistance;Bed level           Upper Body Dressing : Total assistance;Sitting   Lower Body Dressing: Total assistance;+2 for physical assistance;Sit to/from stand   Toilet Transfer: Total assistance;+2 for physical assistance Toilet Transfer Details (indicate cue type and reason): to recliner         Functional mobility during ADLs: Total assistance;+2 for physical assistance  Vision         Perception         Praxis         Pertinent Vitals/Pain Pain Assessment Pain Assessment: Faces Faces Pain Scale: No hurt Pain Intervention(s): Monitored during session, Premedicated before session     Extremity/Trunk Assessment Upper Extremity Assessment Upper  Extremity Assessment: Generalized weakness (limited shoulder flexion bilaterally to 90*)   Lower Extremity Assessment Lower Extremity Assessment: Defer to PT evaluation (s/p R LE surgery)       Communication Communication Communication: Impaired Factors Affecting Communication: Difficulty expressing self;Reduced clarity of speech   Cognition Arousal: Alert Behavior During Therapy: Flat affect Cognition: History of cognitive impairments             OT - Cognition Comments: pt able to state her name, but otherwise not oriented.  follows simple commands with increased time. hx of dementia                 Following commands: Impaired Following commands impaired: Follows one step commands inconsistently     Cueing  General Comments   Cueing Techniques: Verbal cues;Visual cues;Gestural cues;Tactile cues      Exercises     Shoulder Instructions      Home Living Family/patient expects to be discharged to:: Skilled nursing facility                                 Additional Comments: resident at [facility]      Prior Functioning/Environment Prior Level of Function : Patient poor historian/Family not available;Needs assist             Mobility Comments: per chart typically uses wheelchair ADLs Comments: anticipate total care for ADLS    OT Problem List: Decreased activity tolerance;Impaired balance (sitting and/or standing);Pain;Decreased knowledge of precautions;Decreased knowledge of use of DME or AE;Decreased safety awareness   OT Treatment/Interventions: Self-care/ADL training;DME and/or AE instruction;Therapeutic activities;Patient/family education;Balance training      OT Goals(Current goals can be found in the care plan section)   Acute Rehab OT Goals Patient Stated Goal: none stated OT Goal Formulation: Patient unable to participate in goal setting Time For Goal Achievement: 02/01/24 Potential to Achieve Goals: Fair    OT Frequency:  Min 2X/week    Co-evaluation PT/OT/SLP Co-Evaluation/Treatment: Yes Reason for Co-Treatment: For patient/therapist safety;To address functional/ADL transfers;Necessary to address cognition/behavior during functional activity   OT goals addressed during session: ADL's and self-care      AM-PAC OT "6 Clicks" Daily Activity     Outcome Measure Help from another person eating meals?: A Lot Help from another person taking care of personal grooming?: A Lot Help from another person toileting, which includes using toliet, bedpan, or urinal?: Total Help from another person bathing (including washing, rinsing, drying)?: Total Help from another person to put on and taking off regular upper body clothing?: Total Help from another person to put on and taking off regular lower body clothing?: Total 6 Click Score: 8   End of Session Equipment Utilized During Treatment: Gait belt Nurse Communication: Precautions;Mobility status;Need for lift equipment  Activity Tolerance: Patient tolerated treatment well Patient left: in chair;with call bell/phone within reach;with chair alarm set  OT Visit Diagnosis: Other abnormalities of gait and mobility (R26.89);Muscle weakness (generalized) (M62.81);Pain Pain - Right/Left: Right Pain - part of body: Hip;Leg  Time: 6045-4098 OT Time Calculation (min): 22 min Charges:  OT General Charges $OT Visit: 1 Visit OT Evaluation $OT Eval Moderate Complexity: 1 Mod  Bary Boss, OT Acute Rehabilitation Services Office [phone] Secure Chat Preferred    Fredrich Jefferson 01/18/2024, 12:14 PM

## 2024-01-18 NOTE — TOC Initial Note (Signed)
 Transition of Care [facility]) - Initial/Assessment Note    Patient Details  Name: Kristin Mccarty MRN: [id number] Date of Birth: 02/01/1936  [facility]) CM/SW Contact:    Kristin Hals, LCSW Phone Number: 01/18/2024, 2:07 PM  Clinical Narrative:   Pt oriented x0 with [facility] APS/Kristin Mccarty as legal guardian.  Pt from [facility], recommended for rehab now.  CSW spoke with [facility], who confirmed pt can return there for rehab at DC.  Email sent to [employer].  CSW attempted auth, pt is not managed by [employer].  CSW spoke with [facility] again and they will handle insurance auth on their end.                Expected Discharge Plan: Skilled Nursing Facility Barriers to Discharge: Continued Medical Work up   Patient Goals and CMS Choice            Expected Discharge Plan and Services In-house Referral: Clinical Social Work   Post Acute Care Choice: Skilled Nursing Facility Living arrangements for the past 2 months: Skilled Nursing Facility                                      Prior Living Arrangements/Services Living arrangements for the past 2 months: Skilled Nursing Facility Lives with:: Facility Resident Patient language and need for interpreter reviewed:: No        Need for Family Participation in Patient Care: Yes (Comment) Care giver support system in place?: Yes (comment) [facility] is legal guardian) Current home services: Other (comment) (na) Criminal Activity/Legal Involvement Pertinent to Current Situation/Hospitalization: No - Comment as needed  Activities of Daily Living      Permission Sought/Granted                  Emotional Assessment Appearance:: Appears stated age Attitude/Demeanor/Rapport: Unable to Assess Affect (typically observed): Unable to Assess Orientation: :  (not oriented)      Admission diagnosis:  Closed intertrochanteric fracture of right femur [facility]) [S72.141A] Patient Active  Problem List   Diagnosis Date Noted   Fall 01/16/2024   History of dementia 01/16/2024   Closed intertrochanteric fracture of right femur (HCC) 01/15/2024   HYPERLIPIDEMIA 12/29/2009   DEHYDRATION 12/27/2009   HYPERTENSION 12/27/2009   CHF 12/27/2009   RENAL INSUFFICIENCY, CHRONIC 12/27/2009   SKIN RASH 12/27/2009   UTI'S, HX OF 12/27/2009   PCP:  Kristin Form, MD Pharmacy:   [facility] - [place], [place] - [address] [place] [address] Phone: [phone] Fax: [phone]     Social Drivers of Health (SDOH) Social History: SDOH Screenings   Food Insecurity: Patient Unable To Answer (01/15/2024)  Housing: Patient Unable To Answer (01/15/2024)  Transportation Needs: Patient Unable To Answer (01/15/2024)  Utilities: Patient Unable To Answer (01/15/2024)  Social Connections: Patient Unable To Answer (01/15/2024)  Tobacco Use: Low Risk  (01/17/2024)   SDOH Interventions:     Readmission Risk Interventions     No data to display

## 2024-01-18 NOTE — Progress Notes (Signed)
                  Subjective:   Summary: 88 yo F with advanced dementia brought to the ED from her long term care facility after a ground level fall, found to have communited intertrochanteric R femur fracture.   Patient does not appear to be in any distress. She appeared more tired, but not agitated.   Objective:  Vital signs in last 24 hours: Vitals:   01/17/24 2132 01/18/24 0040 01/18/24 0400 01/18/24 0831  BP: (!) 101/53 (!) 105/58 105/63 113/78  Pulse: 73 (!) 56 62 71  Resp: 15 15 16 16   Temp: 97.6 F (36.4 C) (!) 97.4 F (36.3 C) (!) 97.4 F (36.3 C) (!) 97.4 F (36.3 C)  TempSrc: Oral  Oral Axillary  SpO2: 99% 100% 100% 95%   Supplemental O2: Room Air SpO2: 95 % O2 Flow Rate (L/min): 6 L/min  Physical Exam:  Constitutional: pleasantly demented, minimally verbal. Mostly responds with "yes". Sitting peacefully in chair  Assessment/Plan:   R femur intertrochanteric fracture Osteoporosis S/p cephalomedullary nailing of right intertrochanteric femur fracture by orthopedic surgery on 4/29. Per ortho, patient will need PT/OT evaluation and is clear for discharge to SNF from their perspective.  - PT/OT evaluation today - Likely discharge to ALF tomorrow - Scheduled Tylenol 650mg  q6h, tramadol 50-100mg  q6h prn  Anxiety: continue home lorazepam 0.5mg  q8h prn Tardive dyskinesia: home valbenazine 40mg  daily Iron deficiency anemia: home ferrous sulfate 325mg  Diabetes mellitus: diet controlled, blood sugar at goal. Q4h CBG checks  Diet: Dysphagia 3 VTE: Lovenox Code: DNR/DNI on hospice  Dispo: Anticipated discharge to Skilled nursing facility pending medical stability.   Jayson Michael, MD PGY-1 Internal Medicine Resident Pager Number [phone] Please contact the on call pager after 5 pm and on weekends at [phone].

## 2024-01-18 NOTE — NC FL2 (Signed)
 [place]  MEDICAID FL2 LEVEL OF CARE FORM     IDENTIFICATION  Patient Name: Kristin Mccarty Birthdate: 1936-06-14 Sex: female Admission Date (Current Location): 01/15/2024  [place] and [place] Number:  Ernesto Heady [id number] Q Facility and Address:  [facility], [address] [place], [place] [address]      Provider Number: [id number]  Attending Physician Name and Address:  Cherylene Corrente, MD  Relative Name and Phone Number:  [facility] Legal Guardian   [phone]    Current Level of Care: Hospital Recommended Level of Care: Skilled Nursing Facility Prior Approval Number:    Date Approved/Denied:   PASRR Number: [id number]  Discharge Plan: SNF    Current Diagnoses: Patient Active Problem List   Diagnosis Date Noted   Fall 01/16/2024   History of dementia 01/16/2024   Closed intertrochanteric fracture of right femur (HCC) 01/15/2024   HYPERLIPIDEMIA 12/29/2009   DEHYDRATION 12/27/2009   HYPERTENSION 12/27/2009   CHF 12/27/2009   RENAL INSUFFICIENCY, CHRONIC 12/27/2009   SKIN RASH 12/27/2009   UTI'S, HX OF 12/27/2009    Orientation RESPIRATION BLADDER Height & Weight      (not oriented)  Normal Incontinent, External catheter Weight:   Height:     BEHAVIORAL SYMPTOMS/MOOD NEUROLOGICAL BOWEL NUTRITION STATUS      Incontinent Diet (see discharge summary)  AMBULATORY STATUS COMMUNICATION OF NEEDS Skin   Total Care Verbally Surgical wounds                       Personal Care Assistance Level of Assistance  Bathing, Feeding, Dressing, Total care Bathing Assistance: Maximum assistance Feeding assistance: Maximum assistance Dressing Assistance: Maximum assistance Total Care Assistance: Maximum assistance   Functional Limitations Info             SPECIAL CARE FACTORS FREQUENCY  PT (By licensed PT), OT (By licensed OT)     PT Frequency: 5x week OT Frequency: 5x week            Contractures Contractures Info: Not  present    Additional Factors Info  Code Status, Allergies Code Status Info: DNR Allergies Info: NKA           Current Medications (01/18/2024):  This is the current hospital active medication list Current Facility-Administered Medications  Medication Dose Route Frequency Provider Last Rate Last Admin   0.9 %  sodium chloride  infusion   Intravenous Continuous Versie Gores, PA-C 40 mL/hr at 01/17/24 1806 New Bag at 01/17/24 1806   acetaminophen (TYLENOL) tablet 650 mg  650 mg Oral Q6H Versie Gores, PA-C   650 mg at 01/18/24 1128   ceFAZolin (ANCEF) IVPB 2g/100 mL premix  2 g Intravenous Q8H Alona Jamaica A, PA-C 200 mL/hr at 01/18/24 0447 2 g at 01/18/24 0447   Chlorhexidine Gluconate Cloth 2 % PADS 6 each  6 each Topical Daily Versie Gores, PA-C   6 each at 01/18/24 1129   docusate sodium (COLACE) capsule 100 mg  100 mg Oral BID Versie Gores, PA-C   100 mg at 01/18/24 1128   enoxaparin (LOVENOX) injection 40 mg  40 mg Subcutaneous Q24H Pham, Minh Q, RPH-CPP   40 mg at 01/18/24 1128   ferrous sulfate tablet 325 mg  325 mg Oral Q breakfast Versie Gores, PA-C   325 mg at 01/18/24 0805   LORazepam (ATIVAN) tablet 0.5 mg  0.5 mg Oral Q8H PRN Versie Gores, PA-C   0.5 mg at 01/18/24  0805   metoCLOPramide (REGLAN) tablet 5-10 mg  5-10 mg Oral Q8H PRN Versie Gores, PA-C       Or   metoCLOPramide (REGLAN) injection 5-10 mg  5-10 mg Intravenous Q8H PRN Versie Gores, PA-C       mupirocin ointment (BACTROBAN) 2 % 1 Application  1 Application Nasal BID Versie Gores, PA-C   1 Application at 01/18/24 1127   ondansetron (ZOFRAN) tablet 4 mg  4 mg Oral Q6H PRN Versie Gores, PA-C       Or   ondansetron (ZOFRAN) injection 4 mg  4 mg Intravenous Q6H PRN McClung, Sarah A, PA-C       polyethylene glycol (MIRALAX / GLYCOLAX) packet 17 g  17 g Oral Daily PRN McClung, Sarah A, PA-C       senna-docusate (Senokot-S) tablet 1 tablet  1 tablet Oral QHS Versie Gores, PA-C    1 tablet at 01/17/24 2047   traMADol (ULTRAM) tablet 50-100 mg  50-100 mg Oral Q6H PRN Versie Gores, PA-C   100 mg at 01/18/24 0805   valbenazine (INGREZZA) capsule 40 mg  40 mg Oral QPM Versie Gores, PA-C   40 mg at 01/17/24 1610     Discharge Medications: Please see discharge summary for a list of discharge medications.  Relevant Imaging Results:  Relevant Lab Results:   Additional Information SSN: [id number]  Elspeth Hals, LCSW

## 2024-01-19 DIAGNOSIS — Z8659 Personal history of other mental and behavioral disorders: Secondary | ICD-10-CM | POA: Diagnosis not present

## 2024-01-19 DIAGNOSIS — S72141A Displaced intertrochanteric fracture of right femur, initial encounter for closed fracture: Secondary | ICD-10-CM | POA: Diagnosis not present

## 2024-01-19 DIAGNOSIS — W19XXXA Unspecified fall, initial encounter: Secondary | ICD-10-CM | POA: Diagnosis not present

## 2024-01-19 LAB — CBC
HCT: 30 % — ABNORMAL LOW (ref 36.0–46.0)
Hemoglobin: 9.8 g/dL — ABNORMAL LOW (ref 12.0–15.0)
MCH: 26.6 pg (ref 26.0–34.0)
MCHC: 32.7 g/dL (ref 30.0–36.0)
MCV: 81.3 fL (ref 80.0–100.0)
Platelets: 159 10*3/uL (ref 150–400)
RBC: 3.69 MIL/uL — ABNORMAL LOW (ref 3.87–5.11)
RDW: 16.2 % — ABNORMAL HIGH (ref 11.5–15.5)
WBC: 13.7 10*3/uL — ABNORMAL HIGH (ref 4.0–10.5)
nRBC: 0.1 % (ref 0.0–0.2)

## 2024-01-19 LAB — GLUCOSE, CAPILLARY
Glucose-Capillary: 139 mg/dL — ABNORMAL HIGH (ref 70–99)
Glucose-Capillary: 121 mg/dL — ABNORMAL HIGH (ref 70–99)
Glucose-Capillary: 121 mg/dL — ABNORMAL HIGH (ref 70–99)

## 2024-01-19 MED ORDER — SENNA 8.6 MG PO TABS
2.0000 | ORAL_TABLET | Freq: Every day | ORAL | Status: DC
  Administered 2024-01-19: 17.2 mg via ORAL
  Filled 2024-01-19: qty 2

## 2024-01-19 MED ORDER — POLYETHYLENE GLYCOL 3350 17 G PO PACK: 17.0000 g | PACK | Freq: Two times a day (BID) | ORAL | Status: DC

## 2024-01-19 MED ORDER — DOCUSATE SODIUM 50 MG/5ML PO LIQD
50.0000 mg | Freq: Every day | ORAL | Status: DC
  Administered 2024-01-19: 50 mg via ORAL
  Filled 2024-01-19: qty 10

## 2024-01-19 MED ORDER — SENNA 8.6 MG PO TABS: 2.0000 | ORAL_TABLET | Freq: Every day | ORAL | Status: AC

## 2024-01-19 MED ORDER — TRAMADOL HCL 50 MG PO TABS: 50.0000 mg | ORAL_TABLET | Freq: Four times a day (QID) | ORAL | 0 refills | Status: AC | PRN

## 2024-01-19 NOTE — Progress Notes (Signed)
 Orthopaedic Trauma Progress Note  SUBJECTIVE: Doing well this morning. No acute events overnight.  Notes minimal pain in the right hip.  Dementia at baseline  OBJECTIVE:  Vitals:   01/18/24 1959 01/19/24 0402  BP: 126/63 122/65  Pulse: 79 73  Resp: 16 16  Temp: 98 F (36.7 C) 98.2 F (36.8 C)  SpO2: 100% 100%    Opiates Today (MME): Today's  total administered Morphine Milligram Equivalents: 0 Opiates Yesterday (MME): Yesterday's total administered Morphine Milligram Equivalents: 25  General: Sitting up in bed, no acute distress Respiratory: No increased work of breathing.  Operative Extremity (RLE): Dressings clean, dry, intact.  No significant tenderness with palpation over the hip. No significant calf tenderness.  Able to dorsiflex/plantarflex ankle.  Able to wiggle to the toes. + DP pulse  IMAGING: Stable post op imaging.   LABS:  Results for orders placed or performed during the hospital encounter of 01/15/24 (from the past 24 hours)  CBC     Status: Abnormal   Collection Time: 01/18/24  8:49 AM  Result Value Ref Range   WBC 10.7 (H) 4.0 - 10.5 K/uL   RBC 3.42 (L) 3.87 - 5.11 MIL/uL   Hemoglobin 9.0 (L) 12.0 - 15.0 g/dL   HCT 16.1 (L) 09.6 - 04.5 %   MCV 83.6 80.0 - 100.0 fL   MCH 26.3 26.0 - 34.0 pg   MCHC 31.5 30.0 - 36.0 g/dL   RDW 40.9 (H) 81.1 - 91.4 %   Platelets 133 (L) 150 - 400 K/uL   nRBC 0.0 0.0 - 0.2 %  Glucose, capillary     Status: Abnormal   Collection Time: 01/18/24 11:20 AM  Result Value Ref Range   Glucose-Capillary 133 (H) 70 - 99 mg/dL  Basic metabolic panel     Status: Abnormal   Collection Time: 01/18/24 11:35 AM  Result Value Ref Range   Sodium 133 (L) 135 - 145 mmol/L   Potassium 4.4 3.5 - 5.1 mmol/L   Chloride 101 98 - 111 mmol/L   CO2 25 22 - 32 mmol/L   Glucose, Bld 147 (H) 70 - 99 mg/dL   BUN 15 8 - 23 mg/dL   Creatinine, Ser 7.82 0.44 - 1.00 mg/dL   Calcium 8.4 (L) 8.9 - 10.3 mg/dL   GFR, Estimated >95 >62 mL/min   Anion gap 7  5 - 15  Glucose, capillary     Status: Abnormal   Collection Time: 01/18/24  4:28 PM  Result Value Ref Range   Glucose-Capillary 132 (H) 70 - 99 mg/dL  Glucose, capillary     Status: Abnormal   Collection Time: 01/18/24  7:56 PM  Result Value Ref Range   Glucose-Capillary 186 (H) 70 - 99 mg/dL  Glucose, capillary     Status: Abnormal   Collection Time: 01/18/24 11:57 PM  Result Value Ref Range   Glucose-Capillary 119 (H) 70 - 99 mg/dL  Glucose, capillary     Status: Abnormal   Collection Time: 01/19/24  3:59 AM  Result Value Ref Range   Glucose-Capillary 139 (H) 70 - 99 mg/dL    ASSESSMENT: Kristin Mccarty is a 88 y.o. female, 2 Days Post-Op s/p mechanical fall Procedures: CEPHALOMEDULLARY NAILING RIGHT INTERTROCHANTERIC FEMUR FRACTURE  CV/Blood loss: ABLA. Hgb 9.0 on 01/18/24. CBC pending this a.m.  Hemodynamically stable  PLAN: Weightbearing: WBAT RLE ROM: Unrestricted ROM Incisional and dressing care: Ok to remove dressings and leave incisions open to air Showering: Okay to begin showering/getting incisions wet 01/20/2024  Orthopedic device(s): None  Pain management:  1. Tylenol 650 mg q 6 hours scheduled 2. Tramadol 50-100 mg every 6 hours PRN VTE prophylaxis: Aspirin 81 mg, SCDs ID:  Ancef 2gm post op completed Foley/Lines:  No foley, KVO IVFs Impediments to Fracture Healing: Vitamin D level 48, no additional supplementation needed Dispo: PT/OT evaluation ongoing. Okay for discharge from ortho standpoint once cleared by medicine team and therapies  D/C recommendations: - Tramadol and Tylenol for pain control - Aspirin 81 mg daily x 30 days for DVT prophylaxis - No additional need for Vit D supplementation  Follow - up plan: 2 weeks after d/c for wound check and repeat x-rays   Contact information:  Katheryne Pane MD, Alona Jamaica PA-C. After hours and holidays please check [email or web address] for group call information for [facility]   Edilia Gordon, PA-C [phone] (office) [email or web address]

## 2024-01-19 NOTE — TOC Transition Note (Addendum)
 Transition of Care [facility]) - Discharge Note   Patient Details  Name: Kristin Mccarty MRN: [id number] Date of Birth: 10-03-1935  Transition of Care [facility]) CM/SW Contact:  Elspeth Hals, LCSW Phone Number: 01/19/2024, 11:03 AM   Clinical Narrative:   Pt discharging to [facility], room 218P.Aaron Aas  RN call report to [phone].  PTAR called 1100.   1000: CSW confirmed with [facility] that pt can return.  No BM since 4/26, [facility] requested pt receive medication but she can return without BM.  CSW spoke with Lex Redbird legal guardian by phone and informed her of DC.     Final next level of care: Skilled Nursing Facility Barriers to Discharge: Barriers Resolved   Patient Goals and CMS Choice            Discharge Placement              Patient chooses bed at: [facility] Patient to be transferred to facility by: ptar Name of family member notified: [facility] APS legal guardian Patient and family notified of of transfer: 01/19/24  Discharge Plan and Services Additional resources added to the After Visit Summary for   In-house Referral: Clinical Social Work   Post Acute Care Choice: Skilled Nursing Facility                               Social Drivers of Health (SDOH) Interventions SDOH Screenings   Food Insecurity: Patient Unable To Answer (01/15/2024)  Housing: Patient Unable To Answer (01/15/2024)  Transportation Needs: Patient Unable To Answer (01/15/2024)  Utilities: Patient Unable To Answer (01/15/2024)  Social Connections: Patient Unable To Answer (01/15/2024)  Tobacco Use: Low Risk  (01/17/2024)     Readmission Risk Interventions     No data to display

## 2024-01-19 NOTE — Progress Notes (Signed)
 AVS printed and placed in DC packet.  IV removed by NT.  Report called.  PTAR here for patient

## 2024-01-19 NOTE — Discharge Summary (Addendum)
 Name: Kristin Mccarty MRN: [id number] DOB: Nov 23, 1935 88 y.o. PCP: Tita Form, MD  Date of Admission: 01/15/2024  6:30 AM Date of Discharge: 01/19/24 Attending Physician: Dr. Lelia Putnam  Discharge Diagnosis: Principal Problem:   Closed intertrochanteric fracture of right femur [facility]) Active Problems:   Fall   History of dementia    Discharge Medications: Allergies as of 01/19/2024   No Known Allergies      Medication List     STOP taking these medications    cephALEXin  500 MG capsule Commonly known as: KEFLEX        TAKE these medications    acetaminophen 325 MG tablet Commonly known as: TYLENOL Take 2 tablets (650 mg total) by mouth every 6 (six) hours as needed for mild pain (pain score 1-3), fever or headache.   aspirin EC 81 MG tablet Take 1 tablet (81 mg total) by mouth daily. Swallow whole.   chlorhexidine 4 % external liquid Commonly known as: HIBICLENS Apply 15 mLs (1 Application total) topically as directed for 30 doses. Use as directed daily for 5 days every other week for 6 weeks.   ferrous sulfate 325 (65 FE) MG tablet Take 325 mg by mouth daily with breakfast.   Ingrezza 40 MG capsule Generic drug: valbenazine Take 40 mg by mouth every evening.   LORazepam 0.5 MG tablet Commonly known as: ATIVAN Take 0.5 mg by mouth every 8 (eight) hours as needed for anxiety (or agitation).   mupirocin ointment 2 % Commonly known as: BACTROBAN Place 1 Application into the nose 2 (two) times daily for 60 doses. Use as directed 2 times daily for 5 days every other week for 6 weeks.   NUTRITIONAL SUPPLEMENTS PO Take 90 mLs by mouth See admin instructions. MedPass: Drink 90 ml's by mouth 3 times a day after meals   senna 8.6 MG Tabs tablet Commonly known as: SENOKOT Take 2 tablets (17.2 mg total) by mouth daily for 5 days. Start taking on: Jan 20, 2024   sennosides-docusate sodium 8.6-50 MG tablet Commonly known as: SENOKOT-S Take 1 tablet by mouth at  bedtime.   traMADol 50 MG tablet Commonly known as: ULTRAM Take 1 tablet (50 mg total) by mouth every 6 (six) hours as needed for up to 5 days for moderate pain (pain score 4-6) or severe pain (pain score 7-10).        Disposition and follow-up:   [name] was discharged from [facility] in Stable condition.  At the hospital follow up visit please address:  1.  Follow-up:  R femur intertrochanteric fracture s/p cephalomedullary nailing: check for signs of infection, ensure she is receiving PT/OT. She will need orthopedic follow up 2 weeks post op  2.  Labs / imaging needed at time of follow-up: None  3.  Pending labs/ test needing follow-up: None  4.  Medication Changes  Started: Aspirin 81mg  BID for 30 days for DVT ppx. Tramadol 50mg  q6h prn for 5 days  Stopped:  Changed:  Abx -   End Date:  Follow-up Appointments:  Follow-up Information     Haddix, Kristin Huntsman, MD. Schedule an appointment as soon as possible for a visit in 2 week(s).   Specialty: Orthopedic Surgery Why: for wound check and repeat x-rays Contact information: [address] [place] [address] [phone]                Hospital Course by problem list:  Right femur intertrochanteric fracture Osteoporosis Patient presented from her  assisted living facility with R hip pain after a fall from standing. Hip xray in the ED showed acute comminuted fracture deformity through the intertrochanteric portions of the proximal right femur. Orthopedic surgery team was consulted, who performed cephalomedullary nailing of the right femur on 4/28. On post-op day 2 patient's pain was well-controlled, and she was deemed medically stable to be discharged to her ALF. Patient was discharged with a 5 day course of tramadol 50mg  q6h prn, and ASA 81mg  BID for 30 days for DVT ppx  Anxiety: continued home lorazepam 0.5mg  q8h prn Tardive dyskinesia: home valbenazine 40mg  daily Iron deficiency  anemia: home ferrous sulfate 325mg  Diabetes mellitus: diet controlled, blood sugar at goal   Discharge Subjective: Patient is more alert and talkative today. Able to reply to questions with longer answers. Indicates she is only in a little pain. No other complaints. Would like to go back to [facility] today if possible   Discharge Exam:   BP 132/66 (BP Location: Left Arm)   Pulse 71   Temp 97.8 F (36.6 C)   Resp 18   SpO2 100%  Constitutional: pleasantly demented, minimally verbal. Mostly responds with "yes". Sitting peacefully in chair Ext: bandages of R hip c/d/i  Pertinent Labs, Studies, and Procedures:     Latest Ref Rng & Units 01/19/2024    6:22 AM 01/18/2024    8:49 AM 01/17/2024    6:13 AM  CBC  WBC 4.0 - 10.5 K/uL 13.7  10.7  10.8   Hemoglobin 12.0 - 15.0 g/dL 9.8  9.0  65.7   Hematocrit 36.0 - 46.0 % 30.0  28.6  34.6   Platelets 150 - 400 K/uL 159  133  118        Latest Ref Rng & Units 01/18/2024   11:35 AM 01/18/2024    6:36 AM 01/16/2024    4:44 AM  CMP  Glucose 70 - 99 mg/dL 846  962  952   BUN 8 - 23 mg/dL 15  16  9    Creatinine 0.44 - 1.00 mg/dL 8.41  3.24  4.01   Sodium 135 - 145 mmol/L 133  133  135   Potassium 3.5 - 5.1 mmol/L 4.4  5.9  4.6   Chloride 98 - 111 mmol/L 101  101  101   CO2 22 - 32 mmol/L 25  21  24    Calcium 8.9 - 10.3 mg/dL 8.4  8.1  8.8     DG Knee Right Port Result Date: 01/15/2024 CLINICAL DATA:  [id number] Closed comminuted intertrochanteric fracture of proximal end of right femur (HCC) [id number] EXAM: PORTABLE RIGHT KNEE - 1-2 VIEW COMPARISON:  None Available. FINDINGS: Osteopenia. No fracture or dislocation. No definite effusion. No true lateral radiograph is submitted however, limiting sensitivity. Scattered arterial calcifications. IMPRESSION: 1. Limited, no acute findings. 2. Osteopenia. Electronically Signed   By: Kristin Mccarty M.D.   On: 01/15/2024 09:50   DG Chest 1 View Result Date: 01/15/2024 CLINICAL DATA:  Fall. EXAM:  CHEST  1 VIEW COMPARISON:  07/05/2020 FINDINGS: Heart size and mediastinal contours are unremarkable. Low lung volumes with slight asymmetric elevation of the right hemidiaphragm. No pleural fluid, interstitial edema, or airspace disease. The visualized osseous structures appear intact. IMPRESSION: Low lung volumes. No acute findings. Electronically Signed   By: Kimberley Penman M.D.   On: 01/15/2024 07:58   DG HIP UNILAT W OR W/O PELVIS 2-3 VIEWS RIGHT Result Date: 01/15/2024 CLINICAL DATA:  Status post fall. EXAM: DG HIP (  WITH OR WITHOUT PELVIS) 2-3V RIGHT COMPARISON:  None Available. FINDINGS: There is an acute, comminuted fracture deformity through the intertrochanteric portions of the proximal right femur. Proximal displacement of the distal fracture fragments with medial angulation. Left hip is located and intact. Diffuse osteopenia. IMPRESSION: Acute, comminuted fracture deformity through the intertrochanteric portions of the proximal right femur. Electronically Signed   By: Kimberley Penman M.D.   On: 01/15/2024 07:57     Signed: Jayson Michael, MD PGY-1 01/19/2024, 10:38 AM   Pager: [phone]

## 2024-01-19 NOTE — Discharge Instructions (Signed)
 Orthopaedic Trauma Service Discharge Instructions   General Discharge Instructions  WEIGHT BEARING STATUS:weightbearing as tolerated  RANGE OF MOTION/ACTIVITY:unrestricted hip and knee motion  Wound Care: You may remove your surgical dressing on post op day 2 (01/19/24). Incisions can be left open to air if there is no drainage. Once the incision is completely dry and without drainage, it may be left open to air out.  Showering may begin post op day 3 (01/20/24).  Clean incision gently with soap and water.  DVT/PE prophylaxis: Aspirin  Diet: as you were eating previously.  Can use over the counter stool softeners and bowel preparations, such as Miralax, to help with bowel movements.  Narcotics can be constipating.  Be sure to drink plenty of fluids  PAIN MEDICATION USE AND EXPECTATIONS  You have likely been given narcotic medications to help control your pain.  After a traumatic event that results in an fracture (broken bone) with or without surgery, it is ok to use narcotic pain medications to help control one's pain.  We understand that everyone responds to pain differently and each individual patient will be evaluated on a regular basis for the continued need for narcotic medications. Ideally, narcotic medication use should last no more than 6-8 weeks (coinciding with fracture healing).   As a patient it is your responsibility as well to monitor narcotic medication use and report the amount and frequency you use these medications when you come to your office visit.   We would also advise that if you are using narcotic medications, you should take a dose prior to therapy to maximize you participation.  IF YOU ARE ON NARCOTIC MEDICATIONS IT IS NOT PERMISSIBLE TO OPERATE A MOTOR VEHICLE (MOTORCYCLE/CAR/TRUCK/MOPED) OR HEAVY MACHINERY DO NOT MIX NARCOTICS WITH OTHER CNS (CENTRAL NERVOUS SYSTEM) DEPRESSANTS SUCH AS ALCOHOL  POST-OPERATIVE OPIOID TAPER INSTRUCTIONS: It is important to wean off  of your opioid medication as soon as possible. If you do not need pain medication after your surgery it is ok to stop day one. Opioids include: Codeine, Hydrocodone(Norco, Vicodin), Oxycodone(Percocet, oxycontin) and hydromorphone amongst others.  Long term and even short term use of opiods can cause: Increased pain response Dependence Constipation Depression Respiratory depression And more.  Withdrawal symptoms can include Flu like symptoms Nausea, vomiting And more Techniques to manage these symptoms Hydrate well Eat regular healthy meals Stay active Use relaxation techniques(deep breathing, meditating, yoga) Do Not substitute Alcohol to help with tapering If you have been on opioids for less than two weeks and do not have pain than it is ok to stop all together.  Plan to wean off of opioids This plan should start within one week post op of your fracture surgery  Maintain the same interval or time between taking each dose and first decrease the dose.  Cut the total daily intake of opioids by one tablet each day Next start to increase the time between doses. The last dose that should be eliminated is the evening dose.    STOP SMOKING OR USING NICOTINE PRODUCTS!!!!  As discussed nicotine severely impairs your body's ability to heal surgical and traumatic wounds but also impairs bone healing.  Wounds and bone heal by forming microscopic blood vessels (angiogenesis) and nicotine is a vasoconstrictor (essentially, shrinks blood vessels).  Therefore, if vasoconstriction occurs to these microscopic blood vessels they essentially disappear and are unable to deliver necessary nutrients to the healing tissue.  This is one modifiable factor that you can do to dramatically increase your chances  of healing your injury.  (This means no smoking, no nicotine gum, patches, etc)  DO NOT USE NONSTEROIDAL ANTI-INFLAMMATORY DRUGS (NSAID'S)  Using products such as Advil (ibuprofen), Aleve (naproxen),  Motrin (ibuprofen) for additional pain control during fracture healing can delay and/or prevent the healing response.  If you would like to take over the counter (OTC) medication, Tylenol (acetaminophen) is ok.  However, some narcotic medications that are given for pain control contain acetaminophen as well. Therefore, you should not exceed more than 4000 mg of tylenol in a day if you do not have liver disease.  Also note that there are may OTC medicines, such as cold medicines and allergy medicines that my contain tylenol as well.  If you have any questions about medications and/or interactions please ask your doctor/PA or your pharmacist.      ICE AND ELEVATE INJURED/OPERATIVE EXTREMITY  Using ice and elevating the injured extremity above your heart can help with swelling and pain control.  Icing in a pulsatile fashion, such as 20 minutes on and 20 minutes off, can be followed.    Do not place ice directly on skin. Make sure there is a barrier between to skin and the ice pack.    Using frozen items such as frozen peas works well as the conform nicely to the are that needs to be iced.  USE AN ACE WRAP OR TED HOSE FOR SWELLING CONTROL  In addition to icing and elevation, Ace wraps or TED hose are used to help limit and resolve swelling.  It is recommended to use Ace wraps or TED hose until you are informed to stop.    When using Ace Wraps start the wrapping distally (farthest away from the body) and wrap proximally (closer to the body)   Example: If you had surgery on your leg or thing and you do not have a splint on, start the ace wrap at the toes and work your way up to the thigh        If you had surgery on your upper extremity and do not have a splint on, start the ace wrap at your fingers and work your way up to the upper arm   CALL THE OFFICE FOR MEDICATION REFILLS OR WITH ANY QUESTIONS/CONCERNS: [phone]   VISIT OUR WEBSITE FOR ADDITIONAL INFORMATION: [email or web address]    Discharge  Wound Care Instructions  Do NOT apply any ointments, solutions or lotions to pin sites or surgical wounds.  These prevent needed drainage and even though solutions like hydrogen peroxide kill bacteria, they also damage cells lining the pin sites that help fight infection.  Applying lotions or ointments can keep the wounds moist and can cause them to breakdown and open up as well. This can increase the risk for infection. When in doubt call the office.  Surgical incisions should be dressed daily.  If any drainage is noted, use foam dressings - These dressing supplies should be available at local medical supply stores [facility], [facility], etc) as well as Insurance claims handler ([employer], [employer], [employer], etc)  Once the incision is completely dry and without drainage, it may be left open to air out.  Showering may begin 36-48 hours later.  Cleaning gently with soap and water.   Call office for the following: Temperature greater than 101F Persistent nausea and vomiting Severe uncontrolled pain Redness, tenderness, or signs of infection (pain, swelling, redness, odor or green/yellow discharge around the site) Difficulty breathing, headache or visual disturbances Hives Persistent dizziness or light-headedness  Extreme fatigue Any other questions or concerns you may have after discharge  In an emergency, call 911 or go to an Emergency Department at a nearby hospital  OTHER HELPFUL INFORMATION  If you had a block, it will wear off between 8-24 hrs postop typically.  This is period when your pain may go from nearly zero to the pain you would have had postop without the block.  This is an abrupt transition but nothing dangerous is happening.  You may take an extra dose of narcotic when this happens.  You should wean off your narcotic medicines as soon as you are able.  Most patients will be off or using minimal narcotics before their first postop appointment.   We suggest you use the pain  medication the first night prior to going to bed, in order to ease any pain when the anesthesia wears off. You should avoid taking pain medications on an empty stomach as it will make you nauseous.  Do not drink alcoholic beverages or take illicit drugs when taking pain medications.  In most states it is against the law to drive while you are in a splint or sling.  And certainly against the law to drive while taking narcotics.  You may return to work/school in the next couple of days when you feel up to it.   Pain medication may make you constipated.  Below are a few solutions to try in this order: Decrease the amount of pain medication if you aren't having pain. Drink lots of decaffeinated fluids. Drink prune juice and/or each dried prunes  If the first 3 don't work start with additional solutions Take Colace - an over-the-counter stool softener Take Senokot - an over-the-counter laxative Take Miralax - a stronger over-the-counter laxative

## 2024-01-19 NOTE — Progress Notes (Addendum)
                  Subjective:   Summary: 88 yo F with advanced dementia brought to the ED from her long term care facility after a ground level fall, found to have communited intertrochanteric R femur fracture.   Patient is more alert and talkative today. Able to reply to questions with longer answers. Indicates she is only in a little pain. No other complaints. Would like to go back to [facility] today if possible  Objective:  Vital signs in last 24 hours: Vitals:   01/18/24 1531 01/18/24 1959 01/19/24 0402 01/19/24 0737  BP: (!) 141/81 126/63 122/65 132/66  Pulse: 86 79 73 71  Resp: 16 16 16 18   Temp: 97.9 F (36.6 C) 98 F (36.7 C) 98.2 F (36.8 C) 97.8 F (36.6 C)  TempSrc:      SpO2: 99% 100% 100% 100%   Supplemental O2: Room Air SpO2: 100 % O2 Flow Rate (L/min): 6 L/min  Physical Exam:  Constitutional: pleasantly demented, minimally verbal. Mostly responds with "yes". Sitting peacefully in chair Ext: bandages of R hip c/d/i  Assessment/Plan:   R femur intertrochanteric fracture Osteoporosis S/p cephalomedullary nailing of right intertrochanteric femur fracture by orthopedic surgery on 4/28. Per ortho, patient will need PT/OT evaluation and is clear for discharge to SNF from their perspective. If [facility] can accept patient today she can be discharged with further PT/OT - Likely discharge to ALF today - Scheduled Tylenol 650mg  q6h, tramadol 50-100mg  q6h prn - Plan ASA 81mg  BID for 30 days for DVT ppx at hospital discharge  Anxiety: continue home lorazepam 0.5mg  q8h prn Tardive dyskinesia: home valbenazine 40mg  daily Iron deficiency anemia: home ferrous sulfate 325mg  Diabetes mellitus: diet controlled, blood sugar at goal. Q4h CBG checks  Diet: Dysphagia 1 VTE: Lovenox Code: DNR/DNI on hospice  Dispo: Anticipated discharge to Skilled nursing facility pending medical stability.   Jayson Michael, MD PGY-1 Internal Medicine Resident Pager  Number [phone] Please contact the on call pager after 5 pm and on weekends at [phone].
